# Patient Record
Sex: Female | Born: 1960 | Race: White | Hispanic: No | Marital: Single | Smoking: Former smoker
Health system: Southern US, Community
[De-identification: ages and names within clinical notes are randomized; demographics above are authoritative.]

## PROBLEM LIST (undated history)

## (undated) DIAGNOSIS — M359 Systemic involvement of connective tissue, unspecified: Secondary | ICD-10-CM

## (undated) DIAGNOSIS — R87629 Unspecified abnormal cytological findings in specimens from vagina: Secondary | ICD-10-CM

## (undated) HISTORY — PX: COLPOSCOPY: SHX161

## (undated) HISTORY — DX: Systemic involvement of connective tissue, unspecified: M35.9

## (undated) HISTORY — DX: Unspecified abnormal cytological findings in specimens from vagina: R87.629

---

## 2011-12-07 DIAGNOSIS — G47419 Narcolepsy without cataplexy: Secondary | ICD-10-CM | POA: Insufficient documentation

## 2014-12-10 ENCOUNTER — Ambulatory Visit (INDEPENDENT_AMBULATORY_CARE_PROVIDER_SITE_OTHER): Payer: Commercial Indemnity | Admitting: Osteopathic Medicine

## 2014-12-10 ENCOUNTER — Encounter: Payer: Self-pay | Admitting: Osteopathic Medicine

## 2014-12-10 VITALS — BP 134/77 | HR 63 | Wt 126.0 lb

## 2014-12-10 DIAGNOSIS — Z13220 Encounter for screening for lipoid disorders: Secondary | ICD-10-CM | POA: Diagnosis not present

## 2014-12-10 DIAGNOSIS — Z124 Encounter for screening for malignant neoplasm of cervix: Secondary | ICD-10-CM

## 2014-12-10 DIAGNOSIS — Z23 Encounter for immunization: Secondary | ICD-10-CM | POA: Diagnosis not present

## 2014-12-10 DIAGNOSIS — Z0000 Encounter for general adult medical examination without abnormal findings: Secondary | ICD-10-CM | POA: Diagnosis not present

## 2014-12-10 DIAGNOSIS — Z1211 Encounter for screening for malignant neoplasm of colon: Secondary | ICD-10-CM | POA: Diagnosis not present

## 2014-12-10 DIAGNOSIS — Z716 Tobacco abuse counseling: Secondary | ICD-10-CM | POA: Diagnosis not present

## 2014-12-10 DIAGNOSIS — Z1239 Encounter for other screening for malignant neoplasm of breast: Secondary | ICD-10-CM

## 2014-12-10 NOTE — Progress Notes (Signed)
HPI: Teresa Kelley is a 54 y.o. female who presents to [facility]  today for physical for work. No complaints. Has not been to doctor in >15 years. Preventive care reviewed as below.   Past medical, social and family history reviewed: History reviewed. No pertinent past medical history. History reviewed. No pertinent past surgical history. Social History  Substance Use Topics  . Smoking status: Current Every Day Smoker -- 1.00 packs/day for 30 years    Types: Cigarettes  . Smokeless tobacco: Not on file  . Alcohol Use: No   History reviewed. No pertinent family history.  No current outpatient prescriptions on file.   No current facility-administered medications for this visit.   No Known Allergies   Review of Systems: CONSTITUTIONAL: Neg fever/chills, no unintentional weight changes HEAD/EYES/EARS/NOSE: No headache/vision change or hearing change CARDIAC: No chest pain/pressure/palpitations, no orthopnea RESPIRATORY: No cough/shortness of breath/wheeze GASTROINTESTINAL: No nausea/vomiting/abdominal pain/blood in stool/diarrhea/constipation MUSCULOSKELETAL: No myalgia/arthralgia GENITOURINARY: No incontinence, No abnormal genital bleeding/discharge SKIN: No rash/wounds/concerning lesions HEM/ONC: No easy bruising/bleeding, no abnormal lymph node PSYCHIATRIC: No concerns with depression/anxiety or sleep problems    Exam:  Constitutional: VSS (NOTE: reviewed but neglected to enter thees due to computer problem - will address with nurse)  General Appearance: alert, well-developed, well-nourished, NAD Eyes: Normal lids and conjunctive, non-icteric sclera, PERRLA Ears, Nose, Mouth, Throat: Normal external inspection ears/nares/mouth/lips/gums, Normal TM bilaterally, MMM, posterior pharynx without erythema/exudate Neck: No masses, trachea midline. No thyroid enlargement/tenderness/mass appreciated Respiratory: Normal respiratory effort. No  dullness/hyper-resonance to percussion. Breath sounds normal, no wheeze/rhonchi/rales Cardiovascular: S1/S2 normal, no murmur/rub/gallop auscultated. No carotid bruit or JVD. No abdominal aortic bruit. Pedal pulse II/IV bilaterally DP and PT. No lower extremity edema. Gastrointestinal: Nontender, no masses. No hepatomegaly, no splenomegaly. No hernia appreciated. Rectal exam deferred.  Musculoskeletal: Gait normal. No clubbing/cyanosis of digits.  Neurological: No cranial nerve deficit on limited exam. Motor and sensation intact and symmetric Psychiatric: Normal judgment/insight. Normal mood and affect. Oriented x3.    No results found for this or any previous visit (from the past 72 hour(s)). No results found.   ASSESSMENT/PLAN:  Annual physical exam - Preventative care reviewed, see note 12/10/2014  Lipid screening - Plan: Lipid panel, COMPLETE METABOLIC PANEL WITH GFR  Flu vaccine need - Recommended 12/10/2014, patient declines today, will address at next visit - Plan: Flu vaccine, recombinant, trivalent, inj  Need for prophylactic vaccination against Streptococcus pneumoniae (pneumococcus) - Recommended 12/10/2014, patient declines today, will address at next visit - Plan: Pneumococcal polysaccharide vaccine 23-valent greater than or equal to 2yo subcutaneous/IM  Need for TD vaccine - Recommended 12/10/2014, patient declines today, will address at next visit - Plan: Td vaccine greater than or equal to 7yo preservative free IM  Screening for colon cancer - We'll refer for screening colonoscopy - Plan: Ambulatory referral to Gastroenterology  Screening for breast cancer - Will refer for mammogram - Plan: MM DIGITAL SCREENING BILATERAL  Screening for cervical cancer - Discussed need for routine Pap smear, patient declines this today  Vaccines ordered as above in the event the patient decides to come in for nurse visit for these, though she declines them at this time today   FEMALE  PREVENTIVE CARE  ANNUAL SCREENING/COUNSELING Tobacco - Patient has been educated on importance of tobacco cessation to decrease risks of cardiovascular and pulmonary disease and to improve overall health & wellbeing.  Alcohol - NONE Diet/Exercise - HEALTHY HABITS DISCUSSED, plays sports, discussed healthy diet Sexual Health/STI -  NO CONCERNS, abstinence >15 years Depression - PQH2 NEGATIVE Domestic violence - NO CONCERNS HTN - SEE VITALS Vaccination status - Not sure when last Td or Flu  INFECTIOUS DISEASE SCREENING HIV - all adults 15-65 - DECLINED GC/CT - sexually active -  DECLINED HepC - born 55-1964 - DECLINED  DISEASE SCREENING Lipid - age 51+ if risk - WILL ORDER DM2 - overweight age 20-70 or other risk factors - LOW RISK, WILL CHECK NONFASTING GLC Osteoporosis - age 68+ or one sooner if risk - NO NEED  CANCER SCREENING Cervical - Pap q3 yr age 8+, Pap + HPV q5y age 66+ - ADVISED TO HAVE THIS DONE, PATIENT WILL CONSIDER Breast - Mammo age 58+ (C) and biennial age 44-75 (A) - WILL ORDER Lung - low dose CT Chest age 34-80 - AGE 4 WILL NEED Colon - age 84+ or 53 years of age prior to FH Dx - AMENABLE TO COLONOSCOPY  VACCINATION Influenza - annual - RECOMMENDED HPV - age <48yo - NO NEED Zoster - age 18+ - NO NEED Pneumonia - age 68+ sooner if risk (DM, other) - PCV23 NEEDED IN SMOKER TD booster - RECOMMENDED  OTHER Fall - exercise and Vit D age 68+ - NO CONCERNS Consider ASA - age 34-59 - LOW RISK

## 2014-12-11 ENCOUNTER — Encounter: Payer: Self-pay | Admitting: Osteopathic Medicine

## 2014-12-11 LAB — COMPLETE METABOLIC PANEL WITH GFR
ALT: 21 U/L (ref 6–29)
AST: 18 U/L (ref 10–35)
Albumin: 4.4 g/dL (ref 3.6–5.1)
Alkaline Phosphatase: 105 U/L (ref 33–130)
BUN: 23 mg/dL (ref 7–25)
CALCIUM: 9.7 mg/dL (ref 8.6–10.4)
CHLORIDE: 106 mmol/L (ref 98–110)
CO2: 26 mmol/L (ref 20–31)
CREATININE: 0.73 mg/dL (ref 0.50–1.05)
GFR, Est African American: >89 mL/min (ref >=60)
GFR, Est Non African American: >89 mL/min (ref >=60)
GLUCOSE: 87 mg/dL (ref 65–99)
POTASSIUM: 4.5 mmol/L (ref 3.5–5.3)
SODIUM: 142 mmol/L (ref 135–146)
Total Bilirubin: 0.7 mg/dL (ref 0.2–1.2)
Total Protein: 6.7 g/dL (ref 6.1–8.1)

## 2014-12-11 LAB — LIPID PANEL
CHOLESTEROL: 235 mg/dL — AB (ref 125–200)
HDL: 65 mg/dL (ref >=46)
LDL Cholesterol: 150 mg/dL — ABNORMAL HIGH (ref <130)
Total CHOL/HDL Ratio: 3.6 Ratio (ref <=5.0)
Triglycerides: 99 mg/dL (ref <150)
VLDL: 20 mg/dL (ref <30)

## 2015-10-05 ENCOUNTER — Encounter: Payer: Self-pay | Admitting: Osteopathic Medicine

## 2015-10-05 ENCOUNTER — Ambulatory Visit (INDEPENDENT_AMBULATORY_CARE_PROVIDER_SITE_OTHER): Payer: Commercial Indemnity | Admitting: Osteopathic Medicine

## 2015-10-05 VITALS — BP 139/77 | HR 89 | Ht 64.0 in | Wt 134.0 lb

## 2015-10-05 DIAGNOSIS — R21 Rash and other nonspecific skin eruption: Secondary | ICD-10-CM

## 2015-10-05 MED ORDER — CEPHALEXIN 500 MG PO CAPS: 1000.0000 mg | ORAL_CAPSULE | Freq: Two times a day (BID) | ORAL | Status: DC

## 2015-10-05 NOTE — Progress Notes (Signed)
HPI: Teresa Kelley is a 55 y.o. female who [facility] today for chief complaint of:  Chief Complaint  Patient presents with  . Insect Bite    ON RIGHT SIDE OF NECK     . Context: Thinks assisted by insect, about 5 weeks ago, she does not remember specific bite but it was consistent with probably mosquito . Location: Right side of neck . Quality: Red, slightly raised. Rash itself is not painful but under the skin below the rash is a bit tender . Duration: Several weeks . Modifying factors: Patient had tried over-the-counter antifungal to no effect . Assoc signs/symptoms: No fever/chills, no other unusual rashes or joint pain   Past medical, social and family history reviewed: No past medical history on file. No past surgical history on file. Social History  Substance Use Topics  . Smoking status: Current Every Day Smoker -- 1.00 packs/day for 30 years    Types: Cigarettes  . Smokeless tobacco: Not on file  . Alcohol Use: No   No family history on file.  No current outpatient prescriptions on file.   No current facility-administered medications for this visit.   No Known Allergies    Review of Systems: CONSTITUTIONAL:  No  fever, no chills, No recent illness, No unintentional weight changes HEAD/EYES/EARS/NOSE/THROAT: No  headache, no vision change CARDIAC: No  chest pain RESPIRATORY: No  cough, No  shortness of breath GASTROINTESTINAL: No  nausea, No  vomiting, No  abdominal pain MUSCULOSKELETAL: No  myalgia/arthralgia SKIN: No  rash/wounds/concerning lesionsOther than as noted in the history of present illness HEM/ONC: No  easy bruising/bleeding, No  abnormal lymph node NEUROLOGIC: No  weakness, No  dizziness  Exam:  BP 139/77 mmHg  Pulse 89  Ht 5\' 4"  (1.626 m)  Wt 134 lb (60.782 kg)  BMI 22.99 kg/m2 Constitutional: VS see above. General Appearance: alert, well-developed, well-nourished, NAD Eyes: Normal lids  and conjunctive, non-icteric sclera, PERRLA Ears, Nose, Mouth, Throat: MMM, Normal external inspection ears/nares/mouth/lips Neck: No masses, trachea midline. No thyroid enlargement. No tenderness/mass appreciated. No lymphadenopathy, possible small palpable lymph node in area of tenderness inferior to rash on right side of neck, no skin changes overlying this area of tenderness Respiratory: Normal respiratory effort. no wheeze, no rhonchi, no rales Cardiovascular: S1/S2 normal, no murmur, no rub/gallop auscultated. RRR.  Skin: Approximately dime sized, round, mildly erythematous, mildly edematous patch on right side of neck, nontender, no exudate/ulceration. No central clearing consistent with target lesion. No puncture marks. And is otherwise warm, dry, intact without rash/ulcer. No concerning nevi or subq nodules on limited exam.   Psychiatric: Normal judgment/insight. Normal mood and affect. Oriented x3.      ASSESSMENT/PLAN: Rash consistent with possible persistent inflammation/allergy however given lack of pain or itching, this is more doubtful. Possible small tender lymph node underneath the area, given the erythema my concern is for possible mild cellulitic reaction, doesn't appear consistent with fungal infection which would probably be more itchy/dry/scaly. Given the location of the lesion, this would not be something that I would be comfortable doing a punch biopsy for, will trial antibiotics for possible mild cellulitis and place referral to dermatology. Patient can cancel dermatology appointment if the lesion resolves, otherwise would defer biopsy to specialist if needed versus other workup/management. ER/RTC precautions reviewed with the patient with particular regard to fever/joint pain, significant fatigue, worsening of rash, headache, other concerns. Of note, patient not sure when last tetanus vaccine was and  she declines any vaccines or other preventive care measures today.  Rash and  nonspecific skin eruption - Plan: cephALEXin (KEFLEX) 500 MG capsule    All questions were answered. Visit summary with medication list and pertinent instructions was printed for patient to review. ER/RTC precautions were reviewed with the patient. Return if symptoms worsen or fail to improve, and at your convenience for annual welness/preventive care.

## 2015-10-05 NOTE — Patient Instructions (Addendum)
If antibiotics aren't helping, we will go ahead and have you seen by dermatologist. If rash clears up, you may cancel dermatology appointment.    Rash A rash is a change in the color or texture of the skin. There are many different types of rashes. You may have other problems that accompany your rash. CAUSES   Infections.  Allergic reactions. This can include allergies to pets or foods.  Certain medicines.  Exposure to certain chemicals, soaps, or cosmetics.  Heat.  Exposure to poisonous plants.  Tumors, both cancerous and noncancerous. SYMPTOMS   Redness.  Scaly skin.  Itchy skin.  Dry or cracked skin.  Bumps.  Blisters.  Pain. DIAGNOSIS  Your caregiver may do a physical exam to determine what type of rash you have. A skin sample (biopsy) may be taken and examined under a microscope. TREATMENT  Treatment depends on the type of rash you have. Your caregiver may prescribe certain medicines. For serious conditions, you may need to see a skin doctor (dermatologist). HOME CARE INSTRUCTIONS   Avoid the substance that caused your rash.  Do not scratch your rash. This can cause infection.  You may take cool baths to help stop itching.  Only take over-the-counter or prescription medicines as directed by your caregiver.  Keep all follow-up appointments as directed by your caregiver. SEEK IMMEDIATE MEDICAL CARE IF:  You have increasing pain, swelling, or redness.  You have a fever.  You have new or severe symptoms.  You have body aches, diarrhea, or vomiting.  Your rash is not better after 3 days. MAKE SURE YOU:  Understand these instructions.  Will watch your condition.  Will get help right away if you are not doing well or get worse.   This information is not intended to replace advice given to you by your health care provider. Make sure you discuss any questions you have with your health care provider.   Document Released: 03/25/2002 Document Revised:  04/25/2014 Document Reviewed: 08/20/2014 Elsevier Interactive Patient Education [employer]2016 Elsevier Inc.

## 2015-10-09 ENCOUNTER — Telehealth: Payer: Self-pay

## 2015-10-09 DIAGNOSIS — R21 Rash and other nonspecific skin eruption: Secondary | ICD-10-CM

## 2015-10-09 NOTE — Telephone Encounter (Signed)
Teresa Kelley called and left a message stating her rash has not resolved or improved. The office note mentioned a referral to Dermatology. Can we go ahead and place the referral?

## 2015-10-09 NOTE — Telephone Encounter (Signed)
Patient advised of referral to Dermatology.

## 2015-10-09 NOTE — Telephone Encounter (Signed)
Referral placed, thanks

## 2015-12-04 ENCOUNTER — Other Ambulatory Visit: Payer: Self-pay

## 2015-12-09 ENCOUNTER — Ambulatory Visit (INDEPENDENT_AMBULATORY_CARE_PROVIDER_SITE_OTHER): Payer: Managed Care, Other (non HMO)

## 2015-12-09 DIAGNOSIS — Z1231 Encounter for screening mammogram for malignant neoplasm of breast: Secondary | ICD-10-CM | POA: Diagnosis not present

## 2015-12-09 DIAGNOSIS — Z1239 Encounter for other screening for malignant neoplasm of breast: Secondary | ICD-10-CM

## 2016-04-21 ENCOUNTER — Encounter: Payer: Self-pay | Admitting: Osteopathic Medicine

## 2016-04-21 ENCOUNTER — Ambulatory Visit (INDEPENDENT_AMBULATORY_CARE_PROVIDER_SITE_OTHER): Payer: Commercial Indemnity | Admitting: Osteopathic Medicine

## 2016-04-21 VITALS — BP 131/64 | HR 60 | Ht 64.0 in | Wt 141.0 lb

## 2016-04-21 DIAGNOSIS — Z0000 Encounter for general adult medical examination without abnormal findings: Secondary | ICD-10-CM | POA: Diagnosis not present

## 2016-04-21 DIAGNOSIS — R21 Rash and other nonspecific skin eruption: Secondary | ICD-10-CM | POA: Diagnosis not present

## 2016-04-21 DIAGNOSIS — R499 Unspecified voice and resonance disorder: Secondary | ICD-10-CM | POA: Diagnosis not present

## 2016-04-21 DIAGNOSIS — E559 Vitamin D deficiency, unspecified: Secondary | ICD-10-CM

## 2016-04-21 LAB — CBC WITH DIFFERENTIAL/PLATELET
BASOS ABS: 0 {cells}/uL (ref 0–200)
Basophils Relative: 0 %
EOS PCT: 4 %
Eosinophils Absolute: 220 cells/uL (ref 15–500)
HCT: 39.9 % (ref 35.0–45.0)
HEMOGLOBIN: 13.1 g/dL (ref 11.7–15.5)
LYMPHS ABS: 1980 {cells}/uL (ref 850–3900)
Lymphocytes Relative: 36 %
MCH: 31 pg (ref 27.0–33.0)
MCHC: 32.8 g/dL (ref 32.0–36.0)
MCV: 94.3 fL (ref 80.0–100.0)
MONO ABS: 550 {cells}/uL (ref 200–950)
MPV: 10.7 fL (ref 7.5–12.5)
Monocytes Relative: 10 %
NEUTROS ABS: 2750 {cells}/uL (ref 1500–7800)
Neutrophils Relative %: 50 %
Platelets: 231 10*3/uL (ref 140–400)
RBC: 4.23 MIL/uL (ref 3.80–5.10)
RDW: 13.6 % (ref 11.0–15.0)
WBC: 5.5 10*3/uL (ref 3.8–10.8)

## 2016-04-21 LAB — COMPLETE METABOLIC PANEL WITH GFR
ALT: 21 U/L (ref 6–29)
AST: 19 U/L (ref 10–35)
Albumin: 4.1 g/dL (ref 3.6–5.1)
Alkaline Phosphatase: 90 U/L (ref 33–130)
BILIRUBIN TOTAL: 0.7 mg/dL (ref 0.2–1.2)
BUN: 17 mg/dL (ref 7–25)
CHLORIDE: 109 mmol/L (ref 98–110)
CO2: 23 mmol/L (ref 20–31)
CREATININE: 0.76 mg/dL (ref 0.50–1.05)
Calcium: 9.6 mg/dL (ref 8.6–10.4)
GFR, Est African American: >89 mL/min (ref >=60)
GFR, Est Non African American: 89 mL/min (ref >=60)
GLUCOSE: 85 mg/dL (ref 65–99)
Potassium: 4.5 mmol/L (ref 3.5–5.3)
Sodium: 143 mmol/L (ref 135–146)
TOTAL PROTEIN: 6.6 g/dL (ref 6.1–8.1)

## 2016-04-21 LAB — LIPID PANEL
Cholesterol: 256 mg/dL — ABNORMAL HIGH (ref <200)
HDL: 67 mg/dL (ref >50)
LDL CALC: 174 mg/dL — AB (ref <100)
TRIGLYCERIDES: 73 mg/dL (ref <150)
Total CHOL/HDL Ratio: 3.8 Ratio (ref <5.0)
VLDL: 15 mg/dL (ref <30)

## 2016-04-21 LAB — TSH: TSH: 1.93 m[IU]/L

## 2016-04-21 MED ORDER — CLOBETASOL PROPIONATE 0.05 % EX OINT: 1.0000 "application " | TOPICAL_OINTMENT | Freq: Two times a day (BID) | CUTANEOUS | 1 refills | Status: DC

## 2016-04-21 MED ORDER — LORATADINE 10 MG PO TABS: 10.0000 mg | ORAL_TABLET | Freq: Every day | ORAL | 1 refills | Status: DC

## 2016-04-21 NOTE — Patient Instructions (Addendum)
This rash looks like atopic dermatitis or another benign skin irritation. I am not worried at this point about infection, cancer or a problem with the lymph nodes. Let's try another round of high-potency topical steroids for this rash, as well as oral antihistamine. If rash persists, will need to go back to dermatology for further evaluation & biopsy.   For voice, I think you need to see an ENT specialist who can do a scope to look at the vocal cords - in smokers, hoarseness or voice changes are concerning for damage due to smoking, though other causes are possible.

## 2016-04-21 NOTE — Progress Notes (Signed)
HPI: Teresa Kelley is a 56 y.o. female  who [facility], 04/21/16,  for chief complaint of:  Chief Complaint  Patient presents with  . Rash     . 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 Patient has not followed up since then and rash has progressed in a irregular circular-type pattern . Location: R side of neck,  . Quality: Nonpainful, not itchy. No ulceration, no breaks to the skin. Seems to get more or less severe, can't correlate to any triggering or relieving factors. . Assoc signs/symptoms: hoarseness (bronchitis in October and hasn't been normal since then)     Past medical, surgical, social and family history reviewed: Patient Active Problem List   Diagnosis Date Noted  . Rash and nonspecific skin eruption 10/05/2015   History reviewed. No pertinent surgical history. Social History  Substance Use Topics  . Smoking status: Current Every Day Smoker    Packs/day: 1.00    Years: 30.00    Types: Cigarettes  . Smokeless tobacco: Never Used  . Alcohol use No   History reviewed. No pertinent family history.   Current medication list and allergy/intolerance information reviewed:   No current outpatient prescriptions on file prior to visit.   No current facility-administered medications on file prior to visit.    No Known Allergies    Review of Systems:  Constitutional: No recent illness  HEENT: No  headache, no vision change, +voice changes as per HPI  Cardiac: No  chest pain, No  pressure  Respiratory:  No  shortness of breath. No  Cough  Musculoskeletal: No new myalgia/arthralgia  Skin: +Rash  Hem/Onc: No  easy  bruising/bleeding, No  abnormal lumps/bumps  Neurologic: No  weakness, No  Dizziness   Exam:  BP 131/64   Pulse 60   Ht 5\' 4"  (1.626 m)   Wt 141 lb (64 kg)   BMI 24.20 kg/m   Constitutional: VS see above. General Appearance: alert, well-developed, well-nourished, NAD  Eyes: Normal lids and conjunctive, non-icteric sclera  Ears, Nose, Mouth, Throat: MMM, Normal external inspection ears/nares/mouth/lips/gums.  Neck: No masses, trachea midline. No lymphadenopathy  Respiratory: Normal respiratory effort. no wheeze, no rhonchi, no rales  Cardiovascular: S1/S2 normal, no murmur, no rub/gallop auscultated. RRR.   Musculoskeletal: Gait normal. Symmetric and independent movement of all extremities  Neurological: Normal balance/coordination. No tremor.  Skin: warm, dry, intact. Rash as below - with comparison image from June 2017.Marland Kitchen. Blanches, very minimally raised but not papular. No scaling.   Psychiatric: Normal judgment/insight. Normal mood and affect. Oriented x3.          ASSESSMENT/PLAN:   Defer annual physical for now due to persistent rash requiring further evaluation. Consulting with some colleagues, consideration that rash may be due to nonspecific dermatitis, will trial repeat course of steroids/antihistamines, caution with biopsy due to location/anatomy, would defer to dermatology if biopsy is needed - if this round of treatment doesn't help would send back to dermatologist.  Patient states change in voice over past 2 months since recovering from bronchitis. Active smoker. I doubt has anything to do with the rash, refer to ENT for consideration of laryngoscope.  Rash and nonspecific skin eruption - Plan: CBC with Differential/Platelet, COMPLETE METABOLIC  PANEL WITH GFR, TSH, Fungal Stain, clobetasol ointment (TEMOVATE) 0.05 %, loratadine (CLARITIN) 10 MG tablet  Annual physical exam - Plan: CBC with Differential/Platelet, COMPLETE METABOLIC PANEL WITH GFR, Lipid  panel, TSH, VITAMIN D 25 Hydroxy (Vit-D Deficiency, Fractures)  Hoarseness or changing voice - Plan: Ambulatory referral to ENT    Patient Instructions  This rash looks like atopic dermatitis or another benign skin irritation. I am not worried at this point about infection, cancer or a problem with the lymph nodes. Let's try another round of high-potency topical steroids for this rash, as well as oral antihistamine. If rash persists, will need to go back to dermatology for further evaluation & biopsy.   For voice, I think you need to see an ENT specialist who can do a scope to look at the vocal cords - in smokers, hoarseness or voice changes are concerning for damage due to smoking, though other causes are possible.     Visit summary with medication list and pertinent instructions was printed for patient to review. All questions at time of visit were answered - patient instructed to contact office with any additional concerns. ER/RTC precautions were reviewed with the patient. Follow-up plan: Return for annual physical later date (within the next 6 months or so) .

## 2016-04-22 LAB — VITAMIN D 25 HYDROXY (VIT D DEFICIENCY, FRACTURES): Vit D, 25-Hydroxy: 16 ng/mL — ABNORMAL LOW (ref 30–100)

## 2016-04-25 LAB — FUNGAL STAIN

## 2016-04-26 DIAGNOSIS — E559 Vitamin D deficiency, unspecified: Secondary | ICD-10-CM | POA: Insufficient documentation

## 2016-04-26 MED ORDER — VITAMIN D (ERGOCALCIFEROL) 1.25 MG (50000 UNIT) PO CAPS: 50000.0000 [IU] | ORAL_CAPSULE | ORAL | 0 refills | Status: DC

## 2016-04-26 NOTE — Addendum Note (Signed)
Addended by: Deirdre PippinsALEXANDER, Shelley Pooley M on: 04/26/2016 07:57 AM   Modules accepted: Orders

## 2016-12-27 ENCOUNTER — Encounter: Payer: Self-pay | Admitting: Obstetrics and Gynecology

## 2016-12-27 ENCOUNTER — Ambulatory Visit (INDEPENDENT_AMBULATORY_CARE_PROVIDER_SITE_OTHER): Payer: Managed Care, Other (non HMO) | Admitting: Obstetrics and Gynecology

## 2016-12-27 VITALS — BP 106/67 | HR 68 | Ht 64.0 in | Wt 140.0 lb

## 2016-12-27 DIAGNOSIS — Z1389 Encounter for screening for other disorder: Secondary | ICD-10-CM | POA: Diagnosis not present

## 2016-12-27 DIAGNOSIS — Z01419 Encounter for gynecological examination (general) (routine) without abnormal findings: Secondary | ICD-10-CM | POA: Diagnosis not present

## 2016-12-27 DIAGNOSIS — Z113 Encounter for screening for infections with a predominantly sexual mode of transmission: Secondary | ICD-10-CM

## 2016-12-27 NOTE — Progress Notes (Signed)
Subjective:     Teresa Kelley is a 56 y.o. female G3P3 postmenopausal for 10 years here for a comprehensive physical exam. The patient reports no problems. She is not sexually active. She denies any pelvic pain. She reports the presence of a non pruritic, odorless discharge. She denies any vaginal bleeding  Past Medical History:  Diagnosis Date  . Vaginal Pap smear, abnormal    Past Surgical History:  Procedure Laterality Date  . COLPOSCOPY     Family History  Problem Relation Age of Onset  . Cancer Father   . Colon cancer Father      Social History   Social History  . Marital status: Single    Spouse name: N/A  . Number of children: N/A  . Years of education: N/A   Occupational History  . Not on file.   Social History Main Topics  . Smoking status: Current Every Day Smoker    Packs/day: 1.00    Years: 30.00    Types: Cigarettes  . Smokeless tobacco: Never Used  . Alcohol use No  . Drug use: No  . Sexual activity: No   Other Topics Concern  . Not on file   Social History Narrative  . No narrative on file   Health Maintenance  Topic Date Due  . Hepatitis C Screening  Jan 04, 1961  . HIV Screening  04/22/1976  . TETANUS/TDAP  04/22/1980  . PAP SMEAR  04/22/1982  . COLONOSCOPY  04/23/2011  . INFLUENZA VACCINE  04/21/2017 (Originally 11/16/2016)  . MAMMOGRAM  12/08/2017       Review of Systems Pertinent items are noted in HPI.   Objective:  Blood pressure 106/67, pulse 68, height 5\' 4"  (1.626 m), weight 140 lb (63.5 kg).     GENERAL: Well-developed, well-nourished female in no acute distress.  HEENT: Normocephalic, atraumatic. Sclerae anicteric.  NECK: Supple. Normal thyroid. Raised, erythematous rash which has now crossed the midline of her neck anteriorly. Non tender to touch LUNGS: Clear to auscultation bilaterally.  HEART: Regular rate and rhythm. BREASTS: Symmetric in size. No palpable masses or lymphadenopathy, skin changes, or nipple  drainage. ABDOMEN: Soft, nontender, nondistended. No organomegaly. PELVIC: Normal external female genitalia. Vagina is pink and rugated.  Normal discharge. Normal appearing cervix. Uterus is normal in size. No adnexal mass or tenderness. EXTREMITIES: No cyanosis, clubbing, or edema, 2+ distal pulses.    Assessment:    Healthy female exam.      Plan:    Pap smear with BV, yeast collected Patient is aware that she needs to schedule mammography (reminder letter was received last week) Patient will be contacted with abnormal results RTC prn Follow up with PCP for fasting labs and follow up on neck rash See After Visit Summary for Counseling Recommendations

## 2017-01-02 ENCOUNTER — Other Ambulatory Visit: Payer: Self-pay | Admitting: Obstetrics and Gynecology

## 2017-01-02 ENCOUNTER — Telehealth: Payer: Self-pay | Admitting: *Deleted

## 2017-01-02 LAB — CYTOLOGY - PAP
Bacterial vaginitis: POSITIVE — AB
CANDIDA VAGINITIS: NEGATIVE
DIAGNOSIS: NEGATIVE
HPV (WINDOPATH): NOT DETECTED
Trichomonas: NEGATIVE

## 2017-01-02 MED ORDER — METRONIDAZOLE 500 MG PO TABS: 500.0000 mg | ORAL_TABLET | Freq: Two times a day (BID) | ORAL | 0 refills | Status: DC

## 2017-01-02 NOTE — Telephone Encounter (Signed)
-----   Message from Catalina Antigua, MD sent at 01/02/2017 10:54 AM EDT ----- Please inform patient of BV infection. Rx for flagyl has been e-prescribed  Thanks  Peggy

## 2017-01-02 NOTE — Telephone Encounter (Signed)
LM on voicemail to call office for test results. 

## 2017-02-02 ENCOUNTER — Ambulatory Visit (INDEPENDENT_AMBULATORY_CARE_PROVIDER_SITE_OTHER): Payer: Managed Care, Other (non HMO) | Admitting: Family Medicine

## 2017-02-02 VITALS — BP 117/74 | HR 78 | Temp 97.7°F | Wt 141.0 lb

## 2017-02-02 DIAGNOSIS — R21 Rash and other nonspecific skin eruption: Secondary | ICD-10-CM | POA: Diagnosis not present

## 2017-02-02 DIAGNOSIS — E559 Vitamin D deficiency, unspecified: Secondary | ICD-10-CM

## 2017-02-02 DIAGNOSIS — R52 Pain, unspecified: Secondary | ICD-10-CM | POA: Diagnosis not present

## 2017-02-02 MED ORDER — METRONIDAZOLE 0.75 % EX GEL: 1.0000 "application " | Freq: Two times a day (BID) | CUTANEOUS | 3 refills | Status: DC

## 2017-02-02 NOTE — Patient Instructions (Signed)
Thank you for coming in today.  Apply the metronidazole gel to the skin twice daily.  Get labs today.  If not better consider following up with dermatology.   We are looking for lupus, dermatomyositis, thyroid disease, and others.   Recheck with me in 4 weeks to go over results and plan.    If all labs are normal we may consider treatment with Cymbalta to help manage muscle pain.    Duloxetine delayed-release capsules What is this medicine? DULOXETINE (doo LOX e teen) is used to treat depression, anxiety, and different types of chronic pain. This medicine may be used for other purposes; ask your health care provider or pharmacist if you have questions. COMMON BRAND NAME(S): Cymbalta, Irenka What should I tell my health care provider before I take this medicine? They need to know if you have any of these conditions: -bipolar disorder or a family history of bipolar disorder -glaucoma -kidney disease -liver disease -suicidal thoughts or a previous suicide attempt -taken medicines called MAOIs like Carbex, Eldepryl, Marplan, Nardil, and Parnate within 14 days -an unusual reaction to duloxetine, other medicines, foods, dyes, or preservatives -pregnant or trying to get pregnant -breast-feeding How should I use this medicine? Take this medicine by mouth with a glass of water. Follow the directions on the prescription label. Do not cut, crush or chew this medicine. You can take this medicine with or without food. Take your medicine at regular intervals. Do not take your medicine more often than directed. Do not stop taking this medicine suddenly except upon the advice of your doctor. Stopping this medicine too quickly may cause serious side effects or your condition may worsen. A special MedGuide will be given to you by the pharmacist with each prescription and refill. Be sure to read this information carefully each time. Talk to your pediatrician regarding the use of this medicine in  children. While this drug may be prescribed for children as young as 9 years of age for selected conditions, precautions do apply. Overdosage: If you think you have taken too much of this medicine contact a poison control center or emergency room at once. NOTE: This medicine is only for you. Do not share this medicine with others. What if I miss a dose? If you miss a dose, take it as soon as you can. If it is almost time for your next dose, take only that dose. Do not take double or extra doses. What may interact with this medicine? Do not take this medicine with any of the following medications: -desvenlafaxine -levomilnacipran -linezolid -MAOIs like Carbex, Eldepryl, Marplan, Nardil, and Parnate -methylene blue (injected into a vein) -milnacipran -thioridazine -venlafaxine This medicine may also interact with the following medications: -alcohol -amphetamines -aspirin and aspirin-like medicines -certain antibiotics like ciprofloxacin and enoxacin -certain medicines for blood pressure, heart disease, irregular heart beat -certain medicines for depression, anxiety, or psychotic disturbances -certain medicines for migraine headache like almotriptan, eletriptan, frovatriptan, naratriptan, rizatriptan, sumatriptan, zolmitriptan -certain medicines that treat or prevent blood clots like warfarin, enoxaparin, and dalteparin -cimetidine -fentanyl -lithium -NSAIDS, medicines for pain and inflammation, like ibuprofen or naproxen -phentermine -procarbazine -rasagiline -sibutramine -St. John's wort -theophylline -tramadol -tryptophan This list may not describe all possible interactions. Give your health care provider a list of all the medicines, herbs, non-prescription drugs, or dietary supplements you use. Also tell them if you smoke, drink alcohol, or use illegal drugs. Some items may interact with your medicine. What should I watch for while using this medicine? Tell  your doctor if your  symptoms do not get better or if they get worse. Visit your doctor or health care professional for regular checks on your progress. Because it may take several weeks to see the full effects of this medicine, it is important to continue your treatment as prescribed by your doctor. Patients and their families should watch out for new or worsening thoughts of suicide or depression. Also watch out for sudden changes in feelings such as feeling anxious, agitated, panicky, irritable, hostile, aggressive, impulsive, severely restless, overly excited and hyperactive, or not being able to sleep. If this happens, especially at the beginning of treatment or after a change in dose, call your health care professional. Bonita QuinYou may get drowsy or dizzy. Do not drive, use machinery, or do anything that needs mental alertness until you know how this medicine affects you. Do not stand or sit up quickly, especially if you are an older patient. This reduces the risk of dizzy or fainting spells. Alcohol may interfere with the effect of this medicine. Avoid alcoholic drinks. This medicine can cause an increase in blood pressure. This medicine can also cause a sudden drop in your blood pressure, which may make you feel faint and increase the chance of a fall. These effects are most common when you first start the medicine or when the dose is increased, or during use of other medicines that can cause a sudden drop in blood pressure. Check with your doctor for instructions on monitoring your blood pressure while taking this medicine. Your mouth may get dry. Chewing sugarless gum or sucking hard candy, and drinking plenty of water may help. Contact your doctor if the problem does not go away or is severe. What side effects may I notice from receiving this medicine? Side effects that you should report to your doctor or health care professional as soon as possible: -allergic reactions like skin rash, itching or hives, swelling of the face,  lips, or tongue -anxious -breathing problems -confusion -changes in vision -chest pain -confusion -elevated mood, decreased need for sleep, racing thoughts, impulsive behavior -eye pain -fast, irregular heartbeat -feeling faint or lightheaded, falls -feeling agitated, angry, or irritable -hallucination, loss of contact with reality -high blood pressure -loss of balance or coordination -palpitations -redness, blistering, peeling or loosening of the skin, including inside the mouth -restlessness, pacing, inability to keep still -seizures -stiff muscles -suicidal thoughts or other mood changes -trouble passing urine or change in the amount of urine -trouble sleeping -unusual bleeding or bruising -unusually weak or tired -vomiting -yellowing of the eyes or skin Side effects that usually do not require medical attention (report to your doctor or health care professional if they continue or are bothersome): -change in sex drive or performance -change in appetite or weight -constipation -dizziness -dry mouth -headache -increased sweating -nausea -tired This list may not describe all possible side effects. Call your doctor for medical advice about side effects. You may report side effects to FDA at 1-800-FDA-1088. Where should I keep my medicine? Keep out of the reach of children. Store at room temperature between 20 and 25 degrees C (68 to 77 degrees F). Throw away any unused medicine after the expiration date. NOTE: This sheet is a summary. It may not cover all possible information. If you have questions about this medicine, talk to your doctor, pharmacist, or health care provider.  2018 Elsevier/Gold Standard (2015-09-03 18:16:03)

## 2017-02-02 NOTE — Progress Notes (Signed)
Marrion CoyMichelle Ibbotson is a 56 y.o. female who [facility] today for rash and body aches. Patient notes a rash on her neck ongoing for over a year now. This has been present despite treatment with various steroid creams as well as a referral to dermatology. She's had 2 skin scrapings were negative. She has not had a skin biopsy. She notes the rash is not particularly itchy but does not improve. She notes however that she has developed body aches and muscle weakness in her hips and shoulders. She denies fevers or chills vomiting or diarrhea. She is concerned that there is some significant medical problem causing but the rash and the muscle aches and pain.   Past Medical History:  Diagnosis Date  . Vaginal Pap smear, abnormal    Past Surgical History:  Procedure Laterality Date  . COLPOSCOPY     Social History  Substance Use Topics  . Smoking status: Current Every Day Smoker    Packs/day: 1.00    Years: 30.00    Types: Cigarettes  . Smokeless tobacco: Never Used  . Alcohol use No   family history includes Cancer in her father; Colon cancer in her father.  ROS as above:  Medications: Current Outpatient Prescriptions  Medication Sig Dispense Refill  . b complex vitamins capsule Take 1 capsule by mouth daily.    . clobetasol ointment (TEMOVATE) 0.05 % Apply 1 application topically 2 (two) times daily. To affected area(s) as needed, max 2 weeks to avoid whitening/thinning skin 30 g 1  . loratadine (CLARITIN) 10 MG tablet Take 1 tablet (10 mg total) by mouth daily. 30 tablet 1  . Vitamin D, Ergocalciferol, (DRISDOL) 50000 units CAPS capsule Take 1 capsule (50,000 Units total) by mouth every 7 (seven) days. Take for 8 total doses(weeks) and then plan to recheck levels 8 capsule 0  . metroNIDAZOLE (METROGEL) 0.75 % gel Apply 1 application topically 2 (two) times  daily. 45 g 3   No current facility-administered medications for this visit.    Allergies  Allergen Reactions  . Amoxicillin Nausea And Vomiting    Health Maintenance Health Maintenance  Topic Date Due  . Hepatitis C Screening  23-Nov-1960  . HIV Screening  04/22/1976  . TETANUS/TDAP  04/22/1980  . COLONOSCOPY  04/23/2011  . INFLUENZA VACCINE  04/21/2017 (Originally 11/16/2016)  . MAMMOGRAM  12/08/2017  . PAP SMEAR  12/28/2019     Exam:  BP 117/74   Pulse 78   Temp 97.7 F (36.5 C) (Oral)   Wt 141 lb (64 kg)   BMI 24.20 kg/m  Gen: Well NAD HEENT: EOMI,  MMM Lungs: Normal work of breathing. CTABL Heart: RRR no MRG Abd: NABS, Soft. Nondistended, Nontender Exts: Brisk capillary refill, warm and well perfused.  Skin: Macular erythematous annular lesions with some scale on anterior neck. Similar to previously documented with pictures in the chart. MSK: Slight weakness in pain to shoulder abduction. Normal gait and hip strength.  No results found for this or any previous visit (from the past 72 hour(s)). No results found.    Assessment and Plan: 56 y.o. female with rash muscle aches and pains and weakness. The rash is not behaving like a typical contact dermatitis or tinea corporis. I'm concerned she has some rheumatologic process. Differential includes lupus and dermatomyositis. Plan to obtain the workup listed below as well as start empiric topical treatment with topical metronidazole gel. Recheck in one  month.   Orders Placed This Encounter  Procedures  . Cyclic citrul peptide antibody, IgG  . Comprehensive metabolic panel  . CBC with Differential/Platelet  . Sedimentation rate  . Rheumatoid factor  . ANA  . CK  . C-reactive protein  . Hepatitis C antibody  . HIV antibody  . TSH  . VITAMIN D 25 Hydroxy (Vit-D Deficiency, Fractures)   Meds ordered this encounter  Medications  . metroNIDAZOLE (METROGEL) 0.75 % gel    Sig: Apply 1 application topically 2 (two)  times daily.    Dispense:  45 g    Refill:  3     Discussed warning signs or symptoms. Please see discharge instructions. Patient expresses understanding.  I spent 25 minutes with this patient, greater than 50% was face-to-face time counseling regarding ddx and treatment plan.

## 2017-02-03 LAB — CBC WITH DIFFERENTIAL/PLATELET
BASOS ABS: 19 {cells}/uL (ref 0–200)
BASOS PCT: 0.2 %
EOS PCT: 1.5 %
Eosinophils Absolute: 140 cells/uL (ref 15–500)
HEMATOCRIT: 39.1 % (ref 35.0–45.0)
HEMOGLOBIN: 13.4 g/dL (ref 11.7–15.5)
LYMPHS ABS: 2260 {cells}/uL (ref 850–3900)
MCH: 31.9 pg (ref 27.0–33.0)
MCHC: 34.3 g/dL (ref 32.0–36.0)
MCV: 93.1 fL (ref 80.0–100.0)
MONOS PCT: 8.2 %
MPV: 11.7 fL (ref 7.5–12.5)
Neutro Abs: 6119 cells/uL (ref 1500–7800)
Neutrophils Relative %: 65.8 %
PLATELETS: 183 10*3/uL (ref 140–400)
RBC: 4.2 10*6/uL (ref 3.80–5.10)
RDW: 12.8 % (ref 11.0–15.0)
Total Lymphocyte: 24.3 %
WBC mixed population: 763 cells/uL (ref 200–950)
WBC: 9.3 10*3/uL (ref 3.8–10.8)

## 2017-02-03 LAB — COMPREHENSIVE METABOLIC PANEL
AG RATIO: 1.9 (calc) (ref 1.0–2.5)
ALBUMIN MSPROF: 4.4 g/dL (ref 3.6–5.1)
ALT: 22 U/L (ref 6–29)
AST: 28 U/L (ref 10–35)
Alkaline phosphatase (APISO): 94 U/L (ref 33–130)
BILIRUBIN TOTAL: 0.6 mg/dL (ref 0.2–1.2)
BUN: 24 mg/dL (ref 7–25)
CALCIUM: 9.5 mg/dL (ref 8.6–10.4)
CO2: 27 mmol/L (ref 20–32)
Chloride: 107 mmol/L (ref 98–110)
Creat: 0.75 mg/dL (ref 0.50–1.05)
GLOBULIN: 2.3 g/dL (ref 1.9–3.7)
Glucose, Bld: 81 mg/dL (ref 65–99)
POTASSIUM: 4.7 mmol/L (ref 3.5–5.3)
SODIUM: 140 mmol/L (ref 135–146)
TOTAL PROTEIN: 6.7 g/dL (ref 6.1–8.1)

## 2017-02-03 LAB — SEDIMENTATION RATE: Sed Rate: 41 mm/h — ABNORMAL HIGH (ref 0–30)

## 2017-02-03 LAB — CYCLIC CITRUL PEPTIDE ANTIBODY, IGG: Cyclic Citrullin Peptide Ab: <16 UNITS

## 2017-02-03 LAB — CK: CK TOTAL: 450 U/L — AB (ref 29–143)

## 2017-02-03 LAB — RHEUMATOID FACTOR: Rhuematoid fact SerPl-aCnc: <14 IU/mL (ref <14)

## 2017-02-03 LAB — VITAMIN D 25 HYDROXY (VIT D DEFICIENCY, FRACTURES): VIT D 25 HYDROXY: 42 ng/mL (ref 30–100)

## 2017-02-03 LAB — HEPATITIS C ANTIBODY
Hepatitis C Ab: NONREACTIVE
SIGNAL TO CUT-OFF: 0.03 (ref <1.00)

## 2017-02-03 LAB — ANTI-NUCLEAR AB-TITER (ANA TITER): ANA Titer 1: 1:160 {titer} — ABNORMAL HIGH

## 2017-02-03 LAB — TSH: TSH: 3.22 mIU/L

## 2017-02-03 LAB — C-REACTIVE PROTEIN: CRP: 5.2 mg/L (ref <8.0)

## 2017-02-03 LAB — HIV ANTIBODY (ROUTINE TESTING W REFLEX): HIV 1&2 Ab, 4th Generation: NONREACTIVE

## 2017-02-03 LAB — ANA: ANA: POSITIVE — AB

## 2017-02-09 ENCOUNTER — Telehealth: Payer: Self-pay

## 2017-02-09 NOTE — Addendum Note (Signed)
Addended by: Rodolph BongOREY, Buford Bremer S on: 02/09/2017 07:23 AM   Modules accepted: Orders

## 2017-02-09 NOTE — Telephone Encounter (Signed)
Pt called stating that [facility]  is out of her network and she would like a referral sent to Dr. Pollyann SavoyShaili Deveshwar, MD as she is in network.

## 2017-02-17 ENCOUNTER — Telehealth: Payer: Self-pay | Admitting: Family Medicine

## 2017-02-17 NOTE — Telephone Encounter (Signed)
Dr. Elveria Royalsorey    Teresa Kelley called today stating she had not heard from Rheumatology yet. I called the office she requested and they said Dr. Corliss Skainseveshwar has not reviewed the chart yet and they will schedule once she does. When I informed Teresa Kelley she asked if you would be able to give her something for pain until she can get in with them. Please advise.   Thank you  Arline Aspindy

## 2017-02-20 MED ORDER — MELOXICAM 15 MG PO TABS: 15.0000 mg | ORAL_TABLET | Freq: Every day | ORAL | 0 refills | Status: DC

## 2017-02-20 NOTE — Telephone Encounter (Signed)
We want to avoid medicine that will make it hard for Rheumatology to figure out what is wrong.  I have ordered meloxicam for pain.  Take this medicine daily as needed.

## 2017-03-02 ENCOUNTER — Ambulatory Visit: Payer: Managed Care, Other (non HMO) | Admitting: Family Medicine

## 2017-03-02 ENCOUNTER — Encounter: Payer: Self-pay | Admitting: Family Medicine

## 2017-03-02 VITALS — BP 161/87 | HR 69 | Temp 97.8°F | Ht 64.0 in | Wt 147.0 lb

## 2017-03-02 DIAGNOSIS — R21 Rash and other nonspecific skin eruption: Secondary | ICD-10-CM | POA: Diagnosis not present

## 2017-03-02 DIAGNOSIS — R748 Abnormal levels of other serum enzymes: Secondary | ICD-10-CM | POA: Diagnosis not present

## 2017-03-02 DIAGNOSIS — R7689 Other specified abnormal immunological findings in serum: Secondary | ICD-10-CM | POA: Insufficient documentation

## 2017-03-02 DIAGNOSIS — R52 Pain, unspecified: Secondary | ICD-10-CM | POA: Diagnosis not present

## 2017-03-02 DIAGNOSIS — R768 Other specified abnormal immunological findings in serum: Secondary | ICD-10-CM | POA: Diagnosis not present

## 2017-03-02 MED ORDER — OMEPRAZOLE 40 MG PO CPDR: 40.0000 mg | DELAYED_RELEASE_CAPSULE | Freq: Every day | ORAL | 6 refills | Status: DC

## 2017-03-02 NOTE — Progress Notes (Signed)
Teresa CoyMichelle Kelley is a 56 y.o. female who [facility] today for rash and myalgia.  [name] was seen a month ago for unexplained persistent rash associated with girdle muscular pain and soreness.  This is concerning for rheumatologic process and a workup was initiated showing elevated ANA: 160 titer with an elevated CK.  She was referred to rheumatology however the rheumatology of choice was not in network and the backup rheumatologist was not accepting new patients until April 2019.  [name] has done some research and found some other rheumatologist who are in network with her health insurance company.  She has symptoms are still present and quite obnoxious.  She notes the meloxicam has not been effective.  Additionally patient notes worsening acid reflux.  This is been ongoing for a few weeks.  She has not tried any treatment aside from Tums which have not been effective.   Past Medical History:  Diagnosis Date  . Vaginal Pap smear, abnormal    Past Surgical History:  Procedure Laterality Date  . COLPOSCOPY     Social History   Tobacco Use  . Smoking status: Current Every Day Smoker    Packs/day: 1.00    Years: 30.00    Pack years: 30.00    Types: Cigarettes  . Smokeless tobacco: Never Used  Substance Use Topics  . Alcohol use: No   family history includes Cancer in her father; Colon cancer in her father.  ROS as above:  Medications: Current Outpatient Medications  Medication Sig Dispense Refill  . b complex vitamins capsule Take 1 capsule by mouth daily.    . clobetasol ointment (TEMOVATE) 0.05 % Apply 1 application topically 2 (two) times daily. To affected area(s) as needed, max 2 weeks to avoid whitening/thinning skin 30 g 1  . loratadine (CLARITIN) 10 MG tablet Take 1 tablet (10 mg total) by mouth daily. 30 tablet 1  . meloxicam (MOBIC)  15 MG tablet Take 1 tablet (15 mg total) daily by mouth. 14 tablet 0  . metroNIDAZOLE (METROGEL) 0.75 % gel Apply 1 application topically 2 (two) times daily. 45 g 3  . omeprazole (PRILOSEC) 40 MG capsule Take 1 capsule (40 mg total) daily by mouth. 30 capsule 6  . Vitamin D, Ergocalciferol, (DRISDOL) 50000 units CAPS capsule Take 1 capsule (50,000 Units total) by mouth every 7 (seven) days. Take for 8 total doses(weeks) and then plan to recheck levels 8 capsule 0   No current facility-administered medications for this visit.    Allergies  Allergen Reactions  . Amoxicillin Nausea And Vomiting    Health Maintenance Health Maintenance  Topic Date Due  . TETANUS/TDAP  04/22/1980  . COLONOSCOPY  04/23/2011  . INFLUENZA VACCINE  04/21/2017 (Originally 11/16/2016)  . MAMMOGRAM  12/08/2017  . PAP SMEAR  12/28/2019  . Hepatitis C Screening  Completed  . HIV Screening  Completed     Exam:  BP (!) 161/87   Pulse 69   Temp 97.8 F (36.6 C) (Oral)   Ht 5\' 4"  (1.626 m)   Wt 147 lb 0.6 oz (66.7 kg)   BMI 25.24 kg/m  Gen: Well NAD HEENT: EOMI,  MMM Lungs: Normal work of breathing. CTABL Heart: RRR no MRG Abd: NABS, Soft. Nondistended, Nontender Exts: Brisk capillary refill, warm and well perfused.  Rash: Circular annular erythematous lesions on neck.  Lab Results  Component Value Date   ANA POSITIVE (A) 02/02/2017  Lab Results  Component Value Date   CKTOTAL 450 (H) 02/02/2017      No results found for this or any previous visit (from the past 72 hour(s)). No results found.    Assessment and Plan: 56 y.o. female with associated rash and positive rheumatologic markers.  Concerning for lupus or dermatomyositis.  We will try again to refer to rheumatology.  Patient will contact me in a week for an update regardless.  I would like to wait for rheumatology evaluation for a start treatment for presumed lupus or dermatomyositis however I would not wait indefinitely.  If not better  and if no referral in the near future will start treatment.  Acid reflux symptoms unclear etiology possible recent meloxicam.  Advised patient to stop meloxicam because is not effective and possibly causing acid reflux.  Will treat also with omeprazole.   Orders Placed This Encounter  Procedures  . Ambulatory referral to Rheumatology    Referral Priority:   Routine    Referral Type:   Consultation    Referral Reason:   Specialty Services Required    Referred to Provider:   Francee GentileZiolkowska, Aldona, MD    Number of Visits Requested:   1   Meds ordered this encounter  Medications  . omeprazole (PRILOSEC) 40 MG capsule    Sig: Take 1 capsule (40 mg total) daily by mouth.    Dispense:  30 capsule    Refill:  6     Discussed warning signs or symptoms. Please see discharge instructions. Patient expresses understanding.

## 2017-03-02 NOTE — Patient Instructions (Addendum)
Thank you for coming in today. Let me know if you do not get an answer back soon about Rheumatology.  We can figure something out.  Try to get  Me a list of approved doctors if you cant get in the Dr Herma CarsonZ.  Dont let this go more than 1 week.   We likely will be using a combination of steroids like prednisone initially and the medicines like Plaquinil or Methotrexate.   Start omaprazole.

## 2017-05-25 ENCOUNTER — Ambulatory Visit: Payer: Managed Care, Other (non HMO) | Admitting: Family Medicine

## 2017-05-25 ENCOUNTER — Encounter: Payer: Self-pay | Admitting: Family Medicine

## 2017-05-25 VITALS — BP 129/82 | HR 97 | Ht 64.0 in | Wt 137.0 lb

## 2017-05-25 DIAGNOSIS — IMO0001 Reserved for inherently not codable concepts without codable children: Secondary | ICD-10-CM | POA: Insufficient documentation

## 2017-05-25 DIAGNOSIS — F17200 Nicotine dependence, unspecified, uncomplicated: Secondary | ICD-10-CM | POA: Insufficient documentation

## 2017-05-25 DIAGNOSIS — R768 Other specified abnormal immunological findings in serum: Secondary | ICD-10-CM | POA: Diagnosis not present

## 2017-05-25 DIAGNOSIS — R21 Rash and other nonspecific skin eruption: Secondary | ICD-10-CM | POA: Diagnosis not present

## 2017-05-25 MED ORDER — VARENICLINE TARTRATE 1 MG PO TABS: 1.0000 mg | ORAL_TABLET | Freq: Two times a day (BID) | ORAL | 3 refills | Status: DC

## 2017-05-25 MED ORDER — VARENICLINE TARTRATE 0.5 MG X 11 & 1 MG X 42 PO MISC: ORAL | 0 refills | Status: DC

## 2017-05-25 MED ORDER — HYDROXYCHLOROQUINE SULFATE 200 MG PO TABS: 200.0000 mg | ORAL_TABLET | Freq: Every day | ORAL | 0 refills | Status: DC

## 2017-05-25 MED ORDER — PREDNISONE 5 MG PO TABS: 10.0000 mg | ORAL_TABLET | Freq: Every day | ORAL | 0 refills | Status: DC

## 2017-05-25 NOTE — Patient Instructions (Signed)
Thank you for coming in today. For smoking start Chantix Recheck with Dr Lyn HollingsheadAlexander in 1 month Return sooner if needed.   For Rheumatology? Continue prednisone and Plaquenil.  Call the rheumatology office about referral and PA.

## 2017-05-25 NOTE — Progress Notes (Signed)
Teresa Kelley is a 57 y.o. female who presents to [facility]: Primary Care Sports Medicine today for follow-up rash and myalgia.  Teresa Kelley has been seen several times in the fall 2018 for rash and myalgias with positive ANA.  She was referred to rheumatology who treated with 10 mg of prednisone daily and Plaquenil with a suspicion for lupus.  Rheumatology extended workup and she had positive Sjogren's Antibodies.  She has a follow-up appointment with rheumatology later this month.  She has a problem where the PA she saw rheumatology is not in network but the physician at the same rheumatology office is in network.  She needs a new referral.  She is feeling great on prednisone and Plaquenil but will run out before her next follow-up appointment.  Additionally she would like to discuss smoking cessation.  She currently smokes about a pack a day and has done so for 30 years.  She would like to quit smoking.  She tried some nicotine replacement gum in the past and found to be mildly helpful.  She is very motivated and eager to quit smoking.     Past Medical History:  Diagnosis Date  . Vaginal Pap smear, abnormal    Past Surgical History:  Procedure Laterality Date  . COLPOSCOPY     Social History   Tobacco Use  . Smoking status: Current Every Day Smoker    Packs/day: 1.00    Years: 30.00    Pack years: 30.00    Types: Cigarettes  . Smokeless tobacco: Never Used  Substance Use Topics  . Alcohol use: No   family history includes Cancer in her father; Colon cancer in her father.  ROS as above:  Medications: Current Outpatient Medications  Medication Sig Dispense Refill  . b complex vitamins capsule Take 1 capsule by mouth daily.    . clobetasol ointment (TEMOVATE) 0.05 % Apply 1 application topically 2 (two) times daily. To affected area(s) as needed, max 2 weeks to avoid whitening/thinning  skin 30 g 1  . hydroxychloroquine (PLAQUENIL) 200 MG tablet Take 1 tablet (200 mg total) by mouth daily. 90 tablet 0  . loratadine (CLARITIN) 10 MG tablet Take 1 tablet (10 mg total) by mouth daily. 30 tablet 1  . meloxicam (MOBIC) 15 MG tablet Take 1 tablet (15 mg total) daily by mouth. 14 tablet 0  . metroNIDAZOLE (METROGEL) 0.75 % gel Apply 1 application topically 2 (two) times daily. 45 g 3  . omeprazole (PRILOSEC) 40 MG capsule Take 1 capsule (40 mg total) daily by mouth. 30 capsule 6  . predniSONE (DELTASONE) 5 MG tablet Take 2 tablets (10 mg total) by mouth daily with breakfast. 180 tablet 0  . Vitamin D, Ergocalciferol, (DRISDOL) 50000 units CAPS capsule Take 1 capsule (50,000 Units total) by mouth every 7 (seven) days. Take for 8 total doses(weeks) and then plan to recheck levels 8 capsule 0  . varenicline (CHANTIX STARTING MONTH PAK) 0.5 MG X 11 & 1 MG X 42 tablet Take one 0.5mg  tablet by mouth once daily for 3 days, then increase to one 0.5mg  tablet twice daily for 3 days, then increase to one 1mg  tablet twice daily. 53 tablet 0  . varenicline (CHANTIX) 1 MG tablet Take 1 tablet (1 mg total) by mouth 2 (two) times daily. 60 tablet 3   No current facility-administered medications for this visit.    Allergies  Allergen Reactions  . Amoxicillin Nausea And Vomiting  Health Maintenance Health Maintenance  Topic Date Due  . TETANUS/TDAP  04/22/1980  . COLONOSCOPY  04/23/2011  . INFLUENZA VACCINE  11/16/2016  . MAMMOGRAM  12/08/2017  . PAP SMEAR  12/28/2019  . Hepatitis C Screening  Completed  . HIV Screening  Completed     Exam:  BP 129/82   Pulse 97   Ht 5\' 4"  (1.626 m)   Wt 137 lb (62.1 kg)   SpO2 97%   BMI 23.52 kg/m  Gen: Well NAD HEENT: EOMI,  MMM Lungs: Normal work of breathing. CTABL Heart: RRR no MRG Abd: NABS, Soft. Nondistended, Nontender Exts: Brisk capillary refill, warm and well perfused.  Skin: Rash barely present as a macular mildly erythematous  scattered spots on neck   No results found for this or any previous visit (from the past 72 hour(s)). No results found.    Assessment and Plan: 57 y.o. female with  Rash and myalgia unclear etiology still with rheumatology.  She has a follow-up appointment and I am eager to read the formal diagnosis.  I think it is reasonable for her to continue her current medications which I have refilled.  Ultimately rheumatology should take over care.  I am happy to see her as needed for musculoskeletal issues.  Smoking cessation: This is probably the most important health decision she can make.  I am eager to support it and will start Chantix after discussion.  Follow-up with PCP in 1 month.   Orders Placed This Encounter  Procedures  . Ambulatory referral to Rheumatology    Referral Priority:   Routine    Referral Type:   Consultation    Referral Reason:   Specialty Services Required    Referred to Provider:   Early OsmondPatel, Amit M, MD    Requested Specialty:   Rheumatology    Number of Visits Requested:   1   Meds ordered this encounter  Medications  . hydroxychloroquine (PLAQUENIL) 200 MG tablet    Sig: Take 1 tablet (200 mg total) by mouth daily.    Dispense:  90 tablet    Refill:  0  . predniSONE (DELTASONE) 5 MG tablet    Sig: Take 2 tablets (10 mg total) by mouth daily with breakfast.    Dispense:  180 tablet    Refill:  0  . varenicline (CHANTIX STARTING MONTH PAK) 0.5 MG X 11 & 1 MG X 42 tablet    Sig: Take one 0.5mg  tablet by mouth once daily for 3 days, then increase to one 0.5mg  tablet twice daily for 3 days, then increase to one 1mg  tablet twice daily.    Dispense:  53 tablet    Refill:  0  . varenicline (CHANTIX) 1 MG tablet    Sig: Take 1 tablet (1 mg total) by mouth 2 (two) times daily.    Dispense:  60 tablet    Refill:  3     Discussed warning signs or symptoms. Please see discharge instructions. Patient expresses understanding.  I spent 25 minutes with this patient,  greater than 50% was face-to-face time counseling regarding ddx and treatment plan and clarifying my role vs PCP role.

## 2017-06-08 ENCOUNTER — Ambulatory Visit (INDEPENDENT_AMBULATORY_CARE_PROVIDER_SITE_OTHER): Payer: Managed Care, Other (non HMO) | Admitting: Obstetrics and Gynecology

## 2017-06-08 ENCOUNTER — Encounter: Payer: Self-pay | Admitting: Obstetrics and Gynecology

## 2017-06-08 VITALS — BP 107/64 | HR 66 | Ht 64.0 in | Wt 142.0 lb

## 2017-06-08 DIAGNOSIS — Z113 Encounter for screening for infections with a predominantly sexual mode of transmission: Secondary | ICD-10-CM

## 2017-06-08 DIAGNOSIS — N760 Acute vaginitis: Secondary | ICD-10-CM | POA: Diagnosis not present

## 2017-06-08 NOTE — Progress Notes (Signed)
PT c/o vaginal discharge- no odor or itch

## 2017-06-08 NOTE — Progress Notes (Signed)
57 yo here for the evaluation of the presence f a non-pruritic, odorless discharge for the past 2 weeks. She states that the discharge makes her feel "dirty" and self conscious. She denies any abdominal pain. She is without any other complaints. She admits to sweating a bit more these past few weeks.  Past Medical History:  Diagnosis Date  . Vaginal Pap smear, abnormal    Past Surgical History:  Procedure Laterality Date  . COLPOSCOPY     Family History  Problem Relation Age of Onset  . Cancer Father   . Colon cancer Father    Social History   Tobacco Use  . Smoking status: Current Every Day Smoker    Packs/day: 1.00    Years: 30.00    Pack years: 30.00    Types: Cigarettes  . Smokeless tobacco: Never Used  Substance Use Topics  . Alcohol use: No  . Drug use: No   ROS See pertinent in HPI  Blood pressure 107/64, pulse 66, height 5\' 4"  (1.626 m), weight 142 lb (64.4 kg). GENERAL: Well-developed, well-nourished female in no acute distress.  ABDOMEN: Soft, nontender, nondistended. No organomegaly. PELVIC: Normal external female genitalia. Vagina is pink and rugated.  Normal discharge. Normal appearing cervix. Uterus is normal in size. No adnexal mass or tenderness. EXTREMITIES: No cyanosis, clubbing, or edema, 2+ distal pulses.  A/P 57 yo with vaginitis - wet prep collected - patient will be contacted with abnormal discharge - vulva care instructions provided - RTC prn

## 2017-06-09 LAB — CERVICOVAGINAL ANCILLARY ONLY
BACTERIAL VAGINITIS: NEGATIVE
CANDIDA VAGINITIS: NEGATIVE
Chlamydia: NEGATIVE
NEISSERIA GONORRHEA: NEGATIVE
TRICH (WINDOWPATH): NEGATIVE

## 2017-06-12 ENCOUNTER — Telehealth: Payer: Self-pay

## 2017-06-12 NOTE — Telephone Encounter (Signed)
Left message on pt's phone asking her to call office for results (negative aptima)

## 2017-06-12 NOTE — Telephone Encounter (Signed)
Spoke with pt & let her know that her Aptima results were all negative. Pt states that she is still having the vaginal discharge. I offered the pt an appt to come see the doctor again & she is going to wait and see if it gets better

## 2017-06-22 ENCOUNTER — Encounter: Payer: Self-pay | Admitting: Osteopathic Medicine

## 2017-06-22 ENCOUNTER — Ambulatory Visit: Payer: Managed Care, Other (non HMO) | Admitting: Osteopathic Medicine

## 2017-06-22 VITALS — BP 119/70 | HR 67 | Temp 98.0°F | Wt 142.1 lb

## 2017-06-22 DIAGNOSIS — Z716 Tobacco abuse counseling: Secondary | ICD-10-CM

## 2017-06-22 DIAGNOSIS — R350 Frequency of micturition: Secondary | ICD-10-CM | POA: Diagnosis not present

## 2017-06-22 LAB — POCT URINALYSIS DIPSTICK
Bilirubin, UA: NEGATIVE
Glucose, UA: NEGATIVE
KETONES UA: NEGATIVE
NITRITE UA: NEGATIVE
PH UA: 6.5 (ref 5.0–8.0)
Protein, UA: NEGATIVE
Spec Grav, UA: 1.02 (ref 1.010–1.025)
UROBILINOGEN UA: 0.2 U/dL

## 2017-06-22 MED ORDER — NITROFURANTOIN MONOHYD MACRO 100 MG PO CAPS: 100.0000 mg | ORAL_CAPSULE | Freq: Two times a day (BID) | ORAL | 0 refills | Status: DC

## 2017-06-22 MED ORDER — BUPROPION HCL ER (XL) 150 MG PO TB24: 150.0000 mg | ORAL_TABLET | ORAL | 0 refills | Status: DC

## 2017-06-22 NOTE — Progress Notes (Signed)
HPI: Teresa Kelley is a 57 y.o. female who  has a past medical history of Vaginal Pap smear, abnormal.  she presents to [facility] today, 06/22/17,  for chief complaint of:  Smoking cessation   Patient saw Dr. Denyse Amass to 11/04/2017. At that visit, discussed smoking cessation. At that time, smoking about a pack a day for 30 years and would like to quit. Has tried nicotine replacement gum in the past which was mildly helpful. Dr. Denyse Amass started Chantix and advise follow-up with me in 1 month, patient is here to recheck.   Still smoking. Not taking Chantix. 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   At end of visit, mentioned some lower back pain on L and urinary frequency for the past week or so.    Past medical history, surgical history, social history and family history reviewed. No updates needed.   Current medication list and allergy/intolerance information reviewed.    Current Outpatient Medications on File Prior to Visit  Medication Sig Dispense Refill  . b complex vitamins capsule Take 1 capsule by mouth daily.    . clobetasol ointment (TEMOVATE) 0.05 % Apply 1 application topically 2 (two) times daily. To affected area(s) as needed, max 2 weeks to avoid whitening/thinning skin 30 g 1  . hydroxychloroquine (PLAQUENIL) 200 MG tablet Take 1 tablet (200 mg total) by mouth daily. 90 tablet 0  . loratadine (CLARITIN) 10 MG tablet Take 1 tablet (10 mg total) by mouth daily. 30 tablet 1  . meloxicam (MOBIC) 15 MG tablet Take 1 tablet (15 mg total) daily by mouth. 14 tablet 0  . metroNIDAZOLE (METROGEL) 0.75 % gel Apply 1 application topically 2 (two) times daily. (Patient not taking: Reported on 06/08/2017) 45 g 3  . omeprazole (PRILOSEC) 40 MG capsule Take 1 capsule (40 mg total) daily by mouth. 30 capsule 6  . predniSONE (DELTASONE) 5 MG tablet Take 2 tablets (10 mg total) by mouth daily with  breakfast. (Patient not taking: Reported on 06/08/2017) 180 tablet 0  . varenicline (CHANTIX STARTING MONTH PAK) 0.5 MG X 11 & 1 MG X 42 tablet Take one 0.5mg  tablet by mouth once daily for 3 days, then increase to one 0.5mg  tablet twice daily for 3 days, then increase to one 1mg  tablet twice daily. 53 tablet 0  . varenicline (CHANTIX) 1 MG tablet Take 1 tablet (1 mg total) by mouth 2 (two) times daily. (Patient not taking: Reported on 06/08/2017) 60 tablet 3  . Vitamin D, Ergocalciferol, (DRISDOL) 50000 units CAPS capsule Take 1 capsule (50,000 Units total) by mouth every 7 (seven) days. Take for 8 total doses(weeks) and then plan to recheck levels 8 capsule 0   No current facility-administered medications on file prior to visit.    Allergies  Allergen Reactions  . Amoxicillin Nausea And Vomiting      Review of Systems:  Constitutional: No recent illness  Cardiac: No  chest pain  Respiratory:  No  shortness of breath. No  Cough  Gastrointestinal: No  abdominal pain  Exam:  BP 119/70   Pulse 67   Temp 98 F (36.7 C) (Oral)   Wt 142 lb 1.3 oz (64.4 kg)   BMI 24.39 kg/m   Constitutional: VS see above. General Appearance: alert, well-developed, well-nourished, NAD  Eyes: Normal lids and conjunctive, non-icteric sclera  Ears, Nose, Mouth, Throat: MMM, Normal external inspection ears/nares/mouth/lips/gums.  Neck: No masses, trachea midline.  Respiratory: Normal respiratory effort. no wheeze, no rhonchi, no rales  Cardiovascular: S1/S2 normal, no murmur, no rub/gallop auscultated. RRR.   Musculoskeletal: Gait normal. Symmetric and independent movement of all extremities  Neurological: Normal balance/coordination. No tremor.  Skin: warm, dry, intact.   Psychiatric: Normal judgment/insight. Normal mood and affect. Oriented x3.    Results for orders placed or performed in visit on 06/22/17 (from the past 24 hour(s))  POCT Urinalysis Dipstick     Status: Abnormal    Collection Time: 06/22/17  1:44 PM  Result Value Ref Range   Color, UA yellow    Clarity, UA clear    Glucose, UA negative    Bilirubin, UA negative    Ketones, UA negative    Spec Grav, UA 1.020 1.010 - 1.025   Blood, UA trace-lysed    pH, UA 6.5 5.0 - 8.0   Protein, UA negative    Urobilinogen, UA 0.2 0.2 or 1.0 E.U./dL   Nitrite, UA negative    Leukocytes, UA Small (1+) (A) Negative   Appearance     Odor      ASSESSMENT/PLAN:   Encounter for tobacco use cessation counseling - trial Wellbutrin. Risks and side effects discussed - call if any problems   Urinary frequency - Plan: POCT Urinalysis Dipstick, Urine Culture, Urinalysis, microscopic only   Meds ordered this encounter  Medications  . buPROPion (WELLBUTRIN XL) 150 MG 24 hr tablet    Sig: Take 1 tablet (150 mg total) by mouth every morning.    Dispense:  90 tablet    Refill:  0  . nitrofurantoin, macrocrystal-monohydrate, (MACROBID) 100 MG capsule    Sig: Take 1 capsule (100 mg total) by mouth 2 (two) times daily.    Dispense:  10 capsule    Refill:  0    Patient Instructions  Steps to Quit Smoking Smoking tobacco can be harmful to your health and can affect almost every organ in your body. Smoking puts you, and those around you, at risk for developing many serious chronic diseases. Quitting smoking is difficult, but it is one of the best things that you can do for your health. It is never too late to quit. What are the benefits of quitting smoking? When you quit smoking, you lower your risk of developing serious diseases and conditions, such as:  Lung cancer or lung disease, such as COPD.  Heart disease.  Stroke.  Heart attack.  Infertility.  Osteoporosis and bone fractures.  Additionally, symptoms such as coughing, wheezing, and shortness of breath may get better when you quit. You may also find that you get sick less often because your body is stronger at fighting off colds and infections. If you are  pregnant, quitting smoking can help to reduce your chances of having a baby of low birth weight. How do I get ready to quit? When you decide to quit smoking, create a plan to make sure that you are successful. Before you quit:  Pick a date to quit. Set a date within the next two weeks to give you time to prepare.  Write down the reasons why you are quitting. Keep this list in places where you will see it often, such as on your bathroom mirror or in your car or wallet.  Identify the people, places, things, and activities that make you want to smoke (triggers) and avoid them. Make sure to take these actions: ? Throw away all cigarettes at home, at work, and in your car. ? Throw  away smoking accessories, such as Set designer. ? Clean your car and make sure to empty the ashtray. ? Clean your home, including curtains and carpets.  Tell your family, friends, and coworkers that you are quitting. Support from your loved ones can make quitting easier.  Talk with your health care provider about your options for quitting smoking.  Find out what treatment options are covered by your health insurance.  What strategies can I use to quit smoking? Talk with your healthcare provider about different strategies to quit smoking. Some strategies include:  Quitting smoking altogether instead of gradually lessening how much you smoke over a period of time. Research shows that quitting "cold [place]" is more successful than gradually quitting.  Attending in-person counseling to help you build problem-solving skills. You are more likely to have success in quitting if you attend several counseling sessions. Even short sessions of 10 minutes can be effective.  Finding resources and support systems that can help you to quit smoking and remain smoke-free after you quit. These resources are most helpful when you use them often. They can include: ? Online chats with a Veterinary surgeon. ? Telephone  quitlines. ? Automotive engineer. ? Support groups or group counseling. ? Text messaging programs. ? Mobile phone applications.  Taking medicines to help you quit smoking. (If you are pregnant or breastfeeding, talk with your health care provider first.) Some medicines contain nicotine and some do not. Both types of medicines help with cravings, but the medicines that include nicotine help to relieve withdrawal symptoms. Your health care provider may recommend: ? Nicotine patches, gum, or lozenges. ? Nicotine inhalers or sprays. ? Non-nicotine medicine that is taken by mouth.  Talk with your health care provider about combining strategies, such as taking medicines while you are also receiving in-person counseling. Using these two strategies together makes you more likely to succeed in quitting than if you used either strategy on its own. If you are pregnant or breastfeeding, talk with your health care provider about finding counseling or other support strategies to quit smoking. Do not take medicine to help you quit smoking unless told to do so by your health care provider. What things can I do to make it easier to quit? Quitting smoking might feel overwhelming at first, but there is a lot that you can do to make it easier. Take these important actions:  Reach out to your family and friends and ask that they support and encourage you during this time. Call telephone quitlines, reach out to support groups, or work with a counselor for support.  Ask people who smoke to avoid smoking around you.  Avoid places that trigger you to smoke, such as bars, parties, or smoke-break areas at work.  Spend time around people who do not smoke.  Lessen stress in your life, because stress can be a smoking trigger for some people. To lessen stress, try: ? Exercising regularly. ? Deep-breathing exercises. ? Yoga. ? Meditating. ? Performing a body scan. This involves closing your eyes, scanning your  body from head to toe, and noticing which parts of your body are particularly tense. Purposefully relax the muscles in those areas.  Download or purchase mobile phone or tablet apps (applications) that can help you stick to your quit plan by providing reminders, tips, and encouragement. There are many free apps, such as QuitGuide from the [employer] developer for Disease Control and Prevention). You can find other support for quitting smoking (smoking cessation) through smokefree.gov and other  websites.  How will I feel when I quit smoking? Within the first 24 hours of quitting smoking, you may start to feel some withdrawal symptoms. These symptoms are usually most noticeable 2-3 days after quitting, but they usually do not last beyond 2-3 weeks. Changes or symptoms that you might experience include:  Mood swings.  Restlessness, anxiety, or irritation.  Difficulty concentrating.  Dizziness.  Strong cravings for sugary foods in addition to nicotine.  Mild weight gain.  Constipation.  Nausea.  Coughing or a sore throat.  Changes in how your medicines work in your body.  A depressed mood.  Difficulty sleeping (insomnia).  After the first 2-3 weeks of quitting, you may start to notice more positive results, such as:  Improved sense of smell and taste.  Decreased coughing and sore throat.  Slower heart rate.  Lower blood pressure.  Clearer skin.  The ability to breathe more easily.  Fewer sick days.  Quitting smoking is very challenging for most people. Do not get discouraged if you are not successful the first time. Some people need to make many attempts to quit before they achieve long-term success. Do your best to stick to your quit plan, and talk with your health care provider if you have any questions or concerns. This information is not intended to replace advice given to you by your health care provider. Make sure you discuss any questions you have with your health care  provider. Document Released: 03/29/2001 Document Revised: 12/01/2015 Document Reviewed: 08/19/2014 Elsevier Interactive Patient Education  2018 [employer].     Follow-up plan: Return in about 6 weeks (around 08/03/2017) for ANNUAL PHYSICAL (AM appt, come fasting) .  Visit summary with medication list and pertinent instructions was printed for patient to review, alert [place] if any changes needed. All questions at time of visit were answered - patient instructed to contact office with any additional concerns. ER/RTC precautions were reviewed with the patient and understanding verbalized.   Note: Total time spent 25 minutes, greater than 50% of the visit was spent face-to-face counseling and coordinating care for the following: The primary encounter diagnosis was Encounter for tobacco use cessation counseling. A diagnosis of Urinary frequency was also pertinent to this visit.Marland Kitchen  Please note: voice recognition software was used to produce this document, and typos may escape review. Please contact Dr. Lyn Hollingshead for any needed clarifications.

## 2017-06-22 NOTE — Patient Instructions (Signed)
Steps to Quit Smoking Smoking tobacco can be harmful to your health and can affect almost every organ in your body. Smoking puts you, and those around you, at risk for developing many serious chronic diseases. Quitting smoking is difficult, but it is one of the best things that you can do for your health. It is never too late to quit. What are the benefits of quitting smoking? When you quit smoking, you lower your risk of developing serious diseases and conditions, such as:  Lung cancer or lung disease, such as COPD.  Heart disease.  Stroke.  Heart attack.  Infertility.  Osteoporosis and bone fractures.  Additionally, symptoms such as coughing, wheezing, and shortness of breath may get better when you quit. You may also find that you get sick less often because your body is stronger at fighting off colds and infections. If you are pregnant, quitting smoking can help to reduce your chances of having a baby of low birth weight. How do I get ready to quit? When you decide to quit smoking, create a plan to make sure that you are successful. Before you quit:  Pick a date to quit. Set a date within the next two weeks to give you time to prepare.  Write down the reasons why you are quitting. Keep this list in places where you will see it often, such as on your bathroom mirror or in your car or wallet.  Identify the people, places, things, and activities that make you want to smoke (triggers) and avoid them. Make sure to take these actions: ? Throw away all cigarettes at home, at work, and in your car. ? Throw away smoking accessories, such as ashtrays and lighters. ? Clean your car and make sure to empty the ashtray. ? Clean your home, including curtains and carpets.  Tell your family, friends, and coworkers that you are quitting. Support from your loved ones can make quitting easier.  Talk with your health care provider about your options for quitting smoking.  Find out what treatment  options are covered by your health insurance.  What strategies can I use to quit smoking? Talk with your healthcare provider about different strategies to quit smoking. Some strategies include:  Quitting smoking altogether instead of gradually lessening how much you smoke over a period of time. Research shows that quitting "cold [place]" is more successful than gradually quitting.  Attending in-person counseling to help you build problem-solving skills. You are more likely to have success in quitting if you attend several counseling sessions. Even short sessions of 10 minutes can be effective.  Finding resources and support systems that can help you to quit smoking and remain smoke-free after you quit. These resources are most helpful when you use them often. They can include: ? Online chats with a counselor. ? Telephone quitlines. ? Printed self-help materials. ? Support groups or group counseling. ? Text messaging programs. ? Mobile phone applications.  Taking medicines to help you quit smoking. (If you are pregnant or breastfeeding, talk with your health care provider first.) Some medicines contain nicotine and some do not. Both types of medicines help with cravings, but the medicines that include nicotine help to relieve withdrawal symptoms. Your health care provider may recommend: ? Nicotine patches, gum, or lozenges. ? Nicotine inhalers or sprays. ? Non-nicotine medicine that is taken by mouth.  Talk with your health care provider about combining strategies, such as taking medicines while you are also receiving in-person counseling. Using these two strategies together   makes you more likely to succeed in quitting than if you used either strategy on its own. If you are pregnant or breastfeeding, talk with your health care provider about finding counseling or other support strategies to quit smoking. Do not take medicine to help you quit smoking unless told to do so by your health care  provider. What things can I do to make it easier to quit? Quitting smoking might feel overwhelming at first, but there is a lot that you can do to make it easier. Take these important actions:  Reach out to your family and friends and ask that they support and encourage you during this time. Call telephone quitlines, reach out to support groups, or work with a counselor for support.  Ask people who smoke to avoid smoking around you.  Avoid places that trigger you to smoke, such as bars, parties, or smoke-break areas at work.  Spend time around people who do not smoke.  Lessen stress in your life, because stress can be a smoking trigger for some people. To lessen stress, try: ? Exercising regularly. ? Deep-breathing exercises. ? Yoga. ? Meditating. ? Performing a body scan. This involves closing your eyes, scanning your body from head to toe, and noticing which parts of your body are particularly tense. Purposefully relax the muscles in those areas.  Download or purchase mobile phone or tablet apps (applications) that can help you stick to your quit plan by providing reminders, tips, and encouragement. There are many free apps, such as QuitGuide from the CDC (Centers for Disease Control and Prevention). You can find other support for quitting smoking (smoking cessation) through smokefree.gov and other websites.  How will I feel when I quit smoking? Within the first 24 hours of quitting smoking, you may start to feel some withdrawal symptoms. These symptoms are usually most noticeable 2-3 days after quitting, but they usually do not last beyond 2-3 weeks. Changes or symptoms that you might experience include:  Mood swings.  Restlessness, anxiety, or irritation.  Difficulty concentrating.  Dizziness.  Strong cravings for sugary foods in addition to nicotine.  Mild weight gain.  Constipation.  Nausea.  Coughing or a sore throat.  Changes in how your medicines work in your  body.  A depressed mood.  Difficulty sleeping (insomnia).  After the first 2-3 weeks of quitting, you may start to notice more positive results, such as:  Improved sense of smell and taste.  Decreased coughing and sore throat.  Slower heart rate.  Lower blood pressure.  Clearer skin.  The ability to breathe more easily.  Fewer sick days.  Quitting smoking is very challenging for most people. Do not get discouraged if you are not successful the first time. Some people need to make many attempts to quit before they achieve long-term success. Do your best to stick to your quit plan, and talk with your health care provider if you have any questions or concerns. This information is not intended to replace advice given to you by your health care provider. Make sure you discuss any questions you have with your health care provider. Document Released: 03/29/2001 Document Revised: 12/01/2015 Document Reviewed: 08/19/2014 Elsevier Interactive Patient Education  2018 [employer] Inc.  

## 2017-06-23 LAB — URINE CULTURE
MICRO NUMBER:: 90295389
RESULT: NO GROWTH
SPECIMEN QUALITY: ADEQUATE

## 2017-06-23 LAB — URINALYSIS, MICROSCOPIC ONLY
Bacteria, UA: NONE SEEN /HPF
HYALINE CAST: NONE SEEN /LPF
RBC / HPF: NONE SEEN /HPF (ref 0–2)
Squamous Epithelial / LPF: NONE SEEN /HPF (ref <=5)

## 2017-08-03 ENCOUNTER — Encounter: Payer: Self-pay | Admitting: Osteopathic Medicine

## 2017-08-03 DIAGNOSIS — Z0189 Encounter for other specified special examinations: Secondary | ICD-10-CM

## 2017-08-16 ENCOUNTER — Other Ambulatory Visit: Payer: Self-pay | Admitting: Family Medicine

## 2017-08-22 ENCOUNTER — Other Ambulatory Visit: Payer: Self-pay | Admitting: Family Medicine

## 2017-08-26 ENCOUNTER — Other Ambulatory Visit: Payer: Self-pay | Admitting: Family Medicine

## 2017-09-16 ENCOUNTER — Other Ambulatory Visit: Payer: Self-pay | Admitting: Osteopathic Medicine

## 2017-12-13 ENCOUNTER — Emergency Department (INDEPENDENT_AMBULATORY_CARE_PROVIDER_SITE_OTHER): Payer: Managed Care, Other (non HMO)

## 2017-12-13 ENCOUNTER — Emergency Department (INDEPENDENT_AMBULATORY_CARE_PROVIDER_SITE_OTHER)
Admission: EM | Admit: 2017-12-13 | Discharge: 2017-12-13 | Disposition: A | Discharge status: Home / Self Care | Payer: Managed Care, Other (non HMO) | Source: Home / Self Care | Attending: Family Medicine | Admitting: Family Medicine

## 2017-12-13 ENCOUNTER — Encounter: Payer: Self-pay | Admitting: Emergency Medicine

## 2017-12-13 ENCOUNTER — Other Ambulatory Visit: Payer: Self-pay | Admitting: Osteopathic Medicine

## 2017-12-13 DIAGNOSIS — M5489 Other dorsalgia: Secondary | ICD-10-CM | POA: Diagnosis not present

## 2017-12-13 DIAGNOSIS — R109 Unspecified abdominal pain: Secondary | ICD-10-CM | POA: Diagnosis not present

## 2017-12-13 DIAGNOSIS — M62830 Muscle spasm of back: Secondary | ICD-10-CM

## 2017-12-13 LAB — POCT URINALYSIS DIP (MANUAL ENTRY)
Bilirubin, UA: NEGATIVE
Glucose, UA: NEGATIVE mg/dL
Ketones, POC UA: NEGATIVE mg/dL
Leukocytes, UA: NEGATIVE
Nitrite, UA: NEGATIVE
Protein Ur, POC: NEGATIVE mg/dL
Spec Grav, UA: 1.025 (ref 1.010–1.025)
Urobilinogen, UA: 0.2 E.U./dL
pH, UA: 5.5 (ref 5.0–8.0)

## 2017-12-13 MED ORDER — METHOCARBAMOL 500 MG PO TABS: 500.0000 mg | ORAL_TABLET | Freq: Two times a day (BID) | ORAL | 0 refills | Status: DC

## 2017-12-13 NOTE — Telephone Encounter (Signed)
[facility] requesting med RF for predinisone. Pls advise if RF is appropriate. Thanks.

## 2017-12-13 NOTE — ED Provider Notes (Signed)
[facility]    CSN: [id number] Arrival date & time: 12/13/17  0809     History   Chief Complaint Chief Complaint  Patient presents with  . Back Pain    HPI Teresa Kelley is a 57 y.o. female.   HPI Teresa Kelley is a 57 y.o. female presenting to UC with c/o constant mid to lower back pain for a few months but worse over the last 3 days.  Pain is a constant throbbing.  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  Pain is moderate to severe. Minimal relief with Extra Strength Tylenol.  Denies radiation of pain or numbness down her legs. No urinary symptoms but does question if it is due to her kidneys. She had similar pain start in March of this year. She was prescribed antibiotics for her possible kidneys but urine came back normal so she was advised to not take the antibiotic.  Pain has been intermittent since then. No hx of kidney stones.   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 She does take prednisone 5mg  daily and Plaquenil.  She saw her rheumatologist on 11/30/17 for hip pain but did not mention her back because it was mild at that time.   Past Medical History:  Diagnosis Date  . Vaginal Pap smear, abnormal     Patient Active Problem List   Diagnosis Date Noted  . Smoking 05/25/2017  . ANA positive 03/02/2017  . Elevated CK 03/02/2017  . Body aches 02/02/2017  . Vitamin D deficiency 04/26/2016  . Rash and nonspecific skin eruption 10/05/2015    Past Surgical History:  Procedure Laterality Date  . COLPOSCOPY      OB History    Gravida  4   Para  3   Term  2   Preterm  1   AB      Living        SAB      TAB      Ectopic      Multiple      Live Births  3            Home Medications    Prior to Admission medications   Medication Sig Start Date End Date Taking? Authorizing Provider  b complex vitamins capsule  Take 1 capsule by mouth daily.    [provider]  buPROPion (WELLBUTRIN XL) 150 MG 24 hr tablet Take 1 tablet (150 mg total) by mouth every morning. Must schedule OV for further refills 09/18/17   Sunnie NielsenAlexander, Natalie, DO  CHANTIX CONTINUING MONTH PAK 1 MG tablet TAKE 1 TABLET BY MOUTH TWICE A DAY 08/16/17   Sunnie NielsenAlexander, Natalie, DO  clobetasol ointment (TEMOVATE) 0.05 % Apply 1 application topically 2 (two) times daily. To affected area(s) as needed, max 2 weeks to avoid whitening/thinning skin 04/21/16   Sunnie NielsenAlexander, Natalie, DO  hydroxychloroquine (PLAQUENIL) 200 MG tablet TAKE 1 TABLET BY MOUTH EVERY DAY 08/22/17   Sunnie NielsenAlexander, Natalie, DO  loratadine (CLARITIN) 10 MG tablet Take 1 tablet (10 mg total) by mouth daily. 04/21/16   Sunnie NielsenAlexander, Natalie, DO  meloxicam (MOBIC) 15 MG tablet Take 1 tablet (15 mg total) daily by mouth. 02/20/17   Rodolph Bongorey, Evan S, MD  methocarbamol (ROBAXIN) 500 MG tablet Take 1 tablet (500 mg total) by mouth 2 (two) times daily. 12/13/17   Lurene ShadowPhelps, Geraldin Habermehl O, PA-C  metroNIDAZOLE (METROGEL) 0.75 % gel Apply 1 application topically 2 (two) times daily. 02/02/17   Rodolph Bong, MD  nitrofurantoin, macrocrystal-monohydrate, (MACROBID) 100 MG capsule Take 1 capsule (100 mg total) by mouth 2 (two) times daily. 06/22/17   Sunnie Nielsen, DO  omeprazole (PRILOSEC) 40 MG capsule Take 1 capsule (40 mg total) daily by mouth. 03/02/17   Rodolph Bong, MD  predniSONE (DELTASONE) 5 MG tablet TAKE 2 TABLETS (10 MG TOTAL) BY MOUTH DAILY WITH BREAKFAST. 08/28/17   Sunnie Nielsen, DO  Vitamin D, Ergocalciferol, (DRISDOL) 50000 units CAPS capsule Take 1 capsule (50,000 Units total) by mouth every 7 (seven) days. Take for 8 total doses(weeks) and then plan to recheck levels 04/26/16   Sunnie Nielsen, DO    Family History Family History  Problem Relation Age of Onset  . Cancer Father   . Colon cancer Father     Social History Social History   Tobacco Use  . Smoking status: Current Every Day  Smoker    Packs/day: 1.00    Years: 30.00    Pack years: 30.00    Types: Cigarettes  . Smokeless tobacco: Never Used  Substance Use Topics  . Alcohol use: No  . Drug use: No     Allergies   Amoxicillin   Review of Systems Review of Systems  Musculoskeletal: Positive for back pain and myalgias. Negative for arthralgias.     Physical Exam Triage Vital Signs ED Triage Vitals  Enc Vitals Group     BP 12/13/17 0823 136/81     Pulse Rate 12/13/17 0823 68     Resp --      Temp 12/13/17 0823 98.5 F (36.9 C)     Temp Source 12/13/17 0823 Oral     SpO2 12/13/17 0823 98 %     Weight 12/13/17 0824 149 lb (67.6 kg)     Height --      Head Circumference --      Peak Flow --      Pain Score 12/13/17 0823 7     Pain Loc --      Pain Edu? --      Excl. in GC? --    No data found.  Updated Vital Signs BP 136/81 (BP Location: Right Arm)   Pulse 68   Temp 98.5 F (36.9 C) (Oral)   Wt 149 lb (67.6 kg)   SpO2 98%   BMI 25.58 kg/m   Visual Acuity Right Eye Distance:   Left Eye Distance:   Bilateral Distance:    Right Eye Near:   Left Eye Near:    Bilateral Near:     Physical Exam  Constitutional: She is oriented to person, place, and time. She appears well-developed and well-nourished. No distress.  HENT:  Head: Normocephalic and atraumatic.  Eyes: EOM are normal.  Neck: Normal range of motion.  Cardiovascular: Normal rate.  Pulmonary/Chest: Effort normal.  Musculoskeletal: Normal range of motion. She exhibits tenderness. She exhibits no edema.  No midline spinal tenderness to step-offs. Tenderness to bilateral mid to lower thoracic and upper lumbar muscles.  Full ROM upper and lower extremities bilaterally.   Neurological: She is alert and oriented to person, place, and time.  Skin: Skin is warm and dry. No rash noted. She is not diaphoretic. No erythema.  Psychiatric: She has a normal mood and affect. Her behavior is normal.  Nursing note and vitals  reviewed.    UC Treatments / Results  Labs (all labs  ordered are listed, but only abnormal results are displayed) Labs Reviewed  POCT URINALYSIS DIP (MANUAL ENTRY) - Abnormal; Notable for the following components:      Result Value   Blood, UA small (*)    All other components within normal limits    EKG None  Radiology Dg Thoracic Spine W/swimmers  Result Date: 12/13/2017 CLINICAL DATA:  Mid back pain for the past 3 days.  No known injury. EXAM: THORACIC SPINE - 3 VIEWS COMPARISON:  None. FINDINGS: The thoracic vertebral bodies are preserved in height. There is gentle curvature convex toward the right centered at the thoracolumbar junction. The disc space heights are reasonably well-maintained. The pedicles appear appropriately positioned. There are no abnormal soft tissue densities. The interstitial markings of both lungs are increased. There is calcification in the wall of the aortic arch. IMPRESSION: There is no acute or significant chronic bony abnormality of the thoracic spine. Mild dextrocurvature centered at the thoracolumbar junction may be chronic or could be acute and secondary to muscle spasm. Thoracic aortic atherosclerosis. Coarse interstitial lung markings bilaterally may reflect chronic fibrosis or less likely acute interstitial pneumonia. Electronically Signed   By: David  [place] M.D.   On: 12/13/2017 09:11   Dg Lumbar Spine Complete  Result Date: 12/13/2017 CLINICAL DATA:  Mid back pain for the past 3 days with no known injury. EXAM: LUMBAR SPINE - COMPLETE 4+ VIEW COMPARISON:  Thoracic spine series of today's date. FINDINGS: There is gentle curvature convex toward the right centered at the thoracolumbar junction. The lumbar vertebral bodies are preserved in height. The disc space heights are well maintained. There is no spondylolisthesis. There is no significant facet joint hypertrophy. The pedicles and transverse processes are grossly normal. IMPRESSION: Gentle  dextrocurvature centered at the thoracolumbar junction may reflect muscle spasm. No acute or chronic bony abnormality. No significant disc space height loss. Electronically Signed   By: David  [place] M.D.   On: 12/13/2017 09:13    Procedures Procedures (including critical care time)  Medications Ordered in UC Medications - No data to display  Initial Impression / Assessment and Plan / UC Course  I have reviewed the triage vital signs and the nursing notes.  Pertinent labs & imaging results that were available during my care of the patient were reviewed by me and considered in my medical decision making (see chart for details).     Hx and exam c/w muscle spasm of back. Encouraged home exercises (pt info packet provided). Will tx with Robaxin Encouraged f/u with PCP and/or rheumatology.   Final Clinical Impressions(s) / UC Diagnoses   Final diagnoses:  Flank pain  Back muscle spasm     Discharge Instructions      Robaxin (methocarbamol) is a muscle relaxer and may cause drowsiness. Do not drink alcohol, drive, or operate heavy machinery while taking.  Please schedule a follow up appointment with family medicine or rheumatologist for recheck of symptoms if not improving with the robaxin and home exercises.     ED Prescriptions    Medication Sig Dispense Auth. Provider   methocarbamol (ROBAXIN) 500 MG tablet Take 1 tablet (500 mg total) by mouth 2 (two) times daily. 20 tablet Lurene Shadow, PA-C     Controlled Substance Prescriptions [place] Controlled Substance Registry consulted? Not Applicable   Rolla Plate 12/13/17 1024

## 2017-12-13 NOTE — ED Triage Notes (Signed)
Pt c/o constant back pain for awhile but worse the last 3 days. Pain is a constant throbbing. Denies injury. No urinary sxs. Tylenol helps.

## 2017-12-13 NOTE — Discharge Instructions (Signed)
°  Robaxin (methocarbamol) is a muscle relaxer and may cause drowsiness. Do not drink alcohol, drive, or operate heavy machinery while taking.  Please schedule a follow up appointment with family medicine or rheumatologist for recheck of symptoms if not improving with the robaxin and home exercises.

## 2017-12-14 ENCOUNTER — Ambulatory Visit: Payer: Managed Care, Other (non HMO) | Admitting: Osteopathic Medicine

## 2017-12-14 NOTE — Telephone Encounter (Signed)
Left a detailed vm msg for pt regarding refused med RF. Direct call back information provided.

## 2017-12-25 ENCOUNTER — Encounter: Payer: Self-pay | Admitting: Osteopathic Medicine

## 2017-12-25 ENCOUNTER — Ambulatory Visit: Payer: Managed Care, Other (non HMO) | Admitting: Osteopathic Medicine

## 2017-12-25 DIAGNOSIS — M545 Low back pain, unspecified: Secondary | ICD-10-CM

## 2017-12-25 DIAGNOSIS — R768 Other specified abnormal immunological findings in serum: Secondary | ICD-10-CM

## 2017-12-25 DIAGNOSIS — Z0000 Encounter for general adult medical examination without abnormal findings: Secondary | ICD-10-CM

## 2017-12-25 DIAGNOSIS — E559 Vitamin D deficiency, unspecified: Secondary | ICD-10-CM

## 2017-12-25 DIAGNOSIS — R319 Hematuria, unspecified: Secondary | ICD-10-CM

## 2017-12-25 DIAGNOSIS — G8929 Other chronic pain: Secondary | ICD-10-CM

## 2017-12-25 DIAGNOSIS — R002 Palpitations: Secondary | ICD-10-CM

## 2017-12-25 DIAGNOSIS — R52 Pain, unspecified: Secondary | ICD-10-CM

## 2017-12-25 DIAGNOSIS — Q798 Other congenital malformations of musculoskeletal system: Secondary | ICD-10-CM

## 2017-12-25 DIAGNOSIS — I700 Atherosclerosis of aorta: Secondary | ICD-10-CM

## 2017-12-25 DIAGNOSIS — M25551 Pain in right hip: Secondary | ICD-10-CM

## 2017-12-25 MED ORDER — METHOCARBAMOL 500 MG PO TABS: 500.0000 mg | ORAL_TABLET | Freq: Three times a day (TID) | ORAL | 2 refills | Status: DC | PRN

## 2017-12-25 MED ORDER — MELOXICAM 15 MG PO TABS: 15.0000 mg | ORAL_TABLET | Freq: Every day | ORAL | 2 refills | Status: DC

## 2017-12-25 NOTE — Progress Notes (Signed)
HPI: Teresa Kelley is a 57 y.o. female who  has a past medical history of Vaginal Pap smear, abnormal.  she presents to [facility] today, 12/25/17,  for chief complaint of:  Urgent care f/u - back/flank pain   Seen in urgent care 12 days ago, 12/13/2017, complaint of mid/lower back pain x2 months but worse x3 days, throbbing, no known injury.  X-rays of thoracic and lumbar spine showed mild dextrocurvature centered at the thoracolumbar junction, may reflect muscle spasm, but otherwise no concerns.  UA showed small blood.  Treated for muscle spasm: encouraged home exercises (she has not done these), prescription for Robaxin provided, encourage follow-up with PCP/rheumatology.  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  Saw rheumatology 11/30/2017 but did not mention back pain because it was mild at that time -possible lip biopsy was discussed, prednisone burst was prescribed for hip pain.  Patient is here today to recheck the above issues. She reports feeling fine today as far as back pain, but has some concerns about thoracic aortic atherosclerosis seen on XR, occasional palpitations few times a month usually at night, no CP on exertion, no SOB on exertion. Former smoker - quit this May 2019.       Past medical history, surgical history, and family history reviewed.  Current medication list and allergy/intolerance information reviewed.   (See remainder of HPI, ROS, Phys Exam below)  EKG interpretation: Rate: 55 Rhythm: sinus No ST/T changes concerning for acute ischemia/infarct  Previous EKG no tracings available      ASSESSMENT/PLAN: Diagnoses of Connective tissue anomaly, ANA positive, Body aches, Chronic bilateral low back pain without sciatica, Hematuria, unspecified type, Chronic right hip pain, Vitamin D deficiency, Palpitations, and  Thoracic aorta atherosclerosis (HCC) were pertinent to this visit.   Note: labs ordered for future annual physical - preventive care was not performed or billed today  See pt instructions    Meds ordered this encounter  Medications  . meloxicam (MOBIC) 15 MG tablet    Sig: Take 1 tablet (15 mg total) by mouth daily. As needed for aches/pains    Dispense:  30 tablet    Refill:  2  . methocarbamol (ROBAXIN) 500 MG tablet    Sig: Take 1 tablet (500 mg total) by mouth every 8 (eight) hours as needed for muscle spasms.    Dispense:  30 tablet    Refill:  2    Patient Instructions  Plan:  For palpitations: symptoms more consistent with anxiety or breathing issue/lung damage from smoking rather than heart problem, but given the atherosclerosis of the thoracic aorta we can infer that there is some blood vessel damage from smoking +/- high cholesterol. Will check lab work. EKG today looks fine.   Your connective tissue disorder may also predispose to blood vessel problems such as thoracic aortic atherosclerosis. I think we should consult with a cardiologist to see if you may benefit from other screening for this such as stress test or imaging. You should hear back about setting up an appointment.   For hip pain, recommend follow up with Dr Denyse Amass to see if injection or further imaging might be helpful. I refilled the Robaxin (muscle relaxer) and the Meloxicam (anti-inflammatory) to use as needed.    Follow-up plan: Return for consult w/ Dr Denyse Amass for hip pain, cardiologist for atherosclerosis/palpitations, Dr A 4-6 wk annual.            ############################################ ############################################ ############################################ ############################################  Outpatient Encounter Medications as of 12/25/2017  Medication Sig  . b complex vitamins capsule Take 1 capsule by mouth daily.  Marland Kitchen buPROPion (WELLBUTRIN XL) 150 MG  24 hr tablet Take 1 tablet (150 mg total) by mouth every morning. Must schedule OV for further refills  . CHANTIX CONTINUING MONTH PAK 1 MG tablet TAKE 1 TABLET BY MOUTH TWICE A DAY  . clobetasol ointment (TEMOVATE) 0.05 % Apply 1 application topically 2 (two) times daily. To affected area(s) as needed, max 2 weeks to avoid whitening/thinning skin  . hydroxychloroquine (PLAQUENIL) 200 MG tablet TAKE 1 TABLET BY MOUTH EVERY DAY  . loratadine (CLARITIN) 10 MG tablet Take 1 tablet (10 mg total) by mouth daily.  . meloxicam (MOBIC) 15 MG tablet Take 1 tablet (15 mg total) daily by mouth.  . methocarbamol (ROBAXIN) 500 MG tablet Take 1 tablet (500 mg total) by mouth 2 (two) times daily.  . metroNIDAZOLE (METROGEL) 0.75 % gel Apply 1 application topically 2 (two) times daily.  . nitrofurantoin, macrocrystal-monohydrate, (MACROBID) 100 MG capsule Take 1 capsule (100 mg total) by mouth 2 (two) times daily.  Marland Kitchen omeprazole (PRILOSEC) 40 MG capsule Take 1 capsule (40 mg total) daily by mouth.  . predniSONE (DELTASONE) 5 MG tablet TAKE 2 TABLETS (10 MG TOTAL) BY MOUTH DAILY WITH BREAKFAST.  Marland Kitchen Vitamin D, Ergocalciferol, (DRISDOL) 50000 units CAPS capsule Take 1 capsule (50,000 Units total) by mouth every 7 (seven) days. Take for 8 total doses(weeks) and then plan to recheck levels   No facility-administered encounter medications on file as of 12/25/2017.    Allergies  Allergen Reactions  . Amoxicillin Nausea And Vomiting      Review of Systems:  Constitutional: No recent illness  HEENT: No  headache, no vision change  Cardiac: No  chest pain, No  pressure, +palpitations  Respiratory:  No  shortness of breath. No  Cough  Gastrointestinal: No  abdominal pain  Musculoskeletal: No new myalgia/arthralgia, +chronic R hip pain   Neurologic: No  weakness, No  Dizziness  Psychiatric: No  concerns with depression, No  concerns with anxiety  Exam:  BP 125/75 (BP Location: Left Arm, Patient Position:  Sitting, Cuff Size: Normal)   Pulse 65   Temp 98.2 F (36.8 C) (Oral)   Wt 148 lb 6.4 oz (67.3 kg)   BMI 25.47 kg/m   Constitutional: VS see above. General Appearance: alert, well-developed, well-nourished, NAD  Eyes: Normal lids and conjunctive, non-icteric sclera  Ears, Nose, Mouth, Throat: MMM, Normal external inspection ears/nares/mouth/lips/gums.  Neck: No masses, trachea midline.   Respiratory: Normal respiratory effort. no wheeze, no rhonchi, no rales  Cardiovascular: S1/S2 normal, +faint systolic murmur, no rub/gallop auscultated. RRR.   Musculoskeletal: Gait normal. Symmetric and independent movement of all extremities. Tenderness just to posterior greater trochanter R hip  Neurological: Normal balance/coordination. No tremor.  Skin: warm, dry, intact.   Psychiatric: Normal judgment/insight. Normal mood and affect. Oriented x3.   Visit summary with medication list and pertinent instructions was printed for patient to review, advised to alert [place] if any changes needed. All questions at time of visit were answered - patient instructed to contact office with any additional concerns. ER/RTC precautions were reviewed with the patient and understanding verbalized.   Follow-up plan: Return for consult w/ Dr Denyse Amass for hip pain, cardiologist for atherosclerosis/palpitations, Dr A 4-6 wk annual.   Please note: voice recognition software was used to produce this document, and typos may escape review. Please contact Dr. Lyn Hollingshead for  any needed clarifications.

## 2017-12-25 NOTE — Patient Instructions (Addendum)
Plan:  For palpitations: symptoms more consistent with anxiety or breathing issue/lung damage from smoking rather than heart problem, but given the atherosclerosis of the thoracic aorta we can infer that there is some blood vessel damage from smoking +/- high cholesterol. Will check lab work. EKG today looks fine.   Your connective tissue disorder may also predispose to blood vessel problems such as thoracic aortic atherosclerosis. I think we should consult with a cardiologist to see if you may benefit from other screening for this such as stress test or imaging. You should hear back about setting up an appointment.   For hip pain, recommend follow up with Dr Denyse Amass to see if injection or further imaging might be helpful. I refilled the Robaxin (muscle relaxer) and the Meloxicam (anti-inflammatory) to use as needed.

## 2017-12-26 ENCOUNTER — Ambulatory Visit: Payer: Managed Care, Other (non HMO) | Admitting: Family Medicine

## 2017-12-26 ENCOUNTER — Encounter: Payer: Self-pay | Admitting: Family Medicine

## 2017-12-26 VITALS — BP 114/64 | HR 67 | Ht 64.0 in | Wt 149.0 lb

## 2017-12-26 DIAGNOSIS — M359 Systemic involvement of connective tissue, unspecified: Secondary | ICD-10-CM | POA: Diagnosis not present

## 2017-12-26 DIAGNOSIS — M7061 Trochanteric bursitis, right hip: Secondary | ICD-10-CM

## 2017-12-26 HISTORY — DX: Systemic involvement of connective tissue, unspecified: M35.9

## 2017-12-26 NOTE — Patient Instructions (Signed)
Thank you for coming in today.  Attend PT.  Do the exercises.  Side leg raises 30 reps 2-3x daily.  Cross over stretch Figure 4 stretch Stand IT band stretch.   Use a heating pad as needed.   Recheck in 4 weeks.  Next step is injection.    Trochanteric Bursitis Rehab Ask your health care provider which exercises are safe for you. Do exercises exactly as told by your health care provider and adjust them as directed. It is normal to feel mild stretching, pulling, tightness, or discomfort as you do these exercises, but you should stop right away if you feel sudden pain or your pain gets worse.Do not begin these exercises until told by your health care provider. Stretching exercises These exercises warm up your muscles and joints and improve the movement and flexibility of your hip. These exercises also help to relieve pain and stiffness. Exercise A: Iliotibial band stretch  1. Lie on your side with your left / right leg in the top position. 2. Bend your left / right knee and grab your ankle. 3. Slowly bring your knee back so your thigh is behind your body. 4. Slowly lower your knee toward the floor until you feel a gentle stretch on the outside of your left / right thigh. If you do not feel a stretch and your knee will not fall farther, place the heel of your other foot on top of your outer knee and pull your thigh down farther. 5. Hold this position for __________ seconds. 6. Slowly return to the starting position. Repeat __________ times. Complete this exercise __________ times a day. Strengthening exercises These exercises build strength and endurance in your hip and pelvis. Endurance is the ability to use your muscles for a long time, even after they get tired. Exercise B: Bridge ( hip extensors) 1. Lie on your back on a firm surface with your knees bent and your feet flat on the floor. 2. Tighten your buttocks muscles and lift your buttocks off the floor until your trunk is level  with your thighs. You should feel the muscles working in your buttocks and the back of your thighs. If this exercise is too easy, try doing it with your arms crossed over your chest. 3. Hold this position for __________ seconds. 4. Slowly return to the starting position. 5. Let your muscles relax completely between repetitions. Repeat __________ times. Complete this exercise __________ times a day. Exercise C: Squats ( knee extensors and  quadriceps) 1. Stand in front of a table, with your feet and knees pointing straight ahead. You may rest your hands on the table for balance but not for support. 2. Slowly bend your knees and lower your hips like you are going to sit in a chair. ? Keep your weight over your heels, not over your toes. ? Keep your lower legs upright so they are parallel with the table legs. ? Do not let your hips go lower than your knees. ? Do not bend lower than told by your health care provider. ? If your hip pain increases, do not bend as low. 3. Hold this position for __________ seconds. 4. Slowly push with your legs to return to standing. Do not use your hands to pull yourself to standing. Repeat __________ times. Complete this exercise __________ times a day. Exercise D: Hip hike 1. Stand sideways on a bottom step. Stand on your left / right leg with your other foot unsupported next to the step. You can  hold onto the railing or wall if needed for balance. 2. Keeping your knees straight and your torso square, lift your left / right hip up toward the ceiling. 3. Hold this position for __________ seconds. 4. Slowly let your left / right hip lower toward the floor, past the starting position. Your foot should get closer to the floor. Do not lean or bend your knees. Repeat __________ times. Complete this exercise __________ times a day. Exercise E: Single leg stand 1. Stand near a counter or door frame that you can hold onto for balance as needed. It is helpful to stand in  front of a mirror for this exercise so you can watch your hip. 2. Squeeze your left / right buttock muscles then lift up your other foot. Do not let your left / right hip push out to the side. 3. Hold this position for __________ seconds. Repeat __________ times. Complete this exercise __________ times a day. This information is not intended to replace advice given to you by your health care provider. Make sure you discuss any questions you have with your health care provider. Document Released: 05/12/2004 Document Revised: 12/10/2015 Document Reviewed: 03/20/2015 Elsevier Interactive Patient Education  [employer].

## 2017-12-26 NOTE — Progress Notes (Signed)
Teresa Kelley is a 57 y.o. female who presents to [facility] today for right hip pain.  Francena notes a 2-year history of right lateral hip pain.  This occurred after she fell.  She had pain off and on since the last 2 years.  She notes recently the pain is worsened.  She denies any recent injury.  She has pain in the lateral hip worse when laying on her right side standing from a seated position prolonged standing walking or climbing stairs.  She notes the pain will sometimes wake her from night.  She has a pertinent past medical history for nonspecific connective tissue disease managed otology with Plaquenil and prednisone.  She notes her rheumatologist ordered a hip x-ray and August which was normal.  Notes the pain is quite severe at times and sometimes interferes with her ability to work.    ROS:  As above  Exam:  BP 114/64   Pulse 67   Ht 5\' 4"  (1.626 m)   Wt 149 lb (67.6 kg)   BMI 25.58 kg/m  General: Well Developed, well nourished, and in no acute distress.  Neuro/Psych: Alert and oriented x3, extra-ocular muscles intact, able to move all 4 extremities, sensation grossly intact. Skin: Warm and dry, no rashes noted.  Respiratory: Not using accessory muscles, speaking in full sentences, trachea midline.  Cardiovascular: Pulses palpable, no extremity edema. Abdomen: Does not appear distended. MSK:  Right hip normal-appearing normal motion. Tender palpation greater trochanter. Hip abduction strength diminished 4/5.  Pain significantly present and reproduced with hip abduction strength testing. Antalgic gait. No significant leg length discrepancy.    Lab and Radiology Results Xr Hip Min 2 Views Right8/16/2019 [facility] Result Impression  IMPRESSION: Unremarkable right hip.  Electronically Signed by: Leane Para  Result Narrative  INDICATION: Pain  COMPARISON: None  TECHNIQUE: 2 views right hip  FINDINGS: Right  hip joint is well-maintained. No acute fractures or subluxations. Pelvic phleboliths.   I personally (independently) visualized and performed the interpretation of the images attached in this note.     Assessment and Plan: 57 y.o. female with  Right lateral hip pain is trochanteric bursitis/hip abductor tendinitis.  Plan for physical therapy home exercise program and heating pad and continued over-the-counter medicines for pain.  Discussed trochanter injection.  Will defer that for today and perform that the future if needed.  Recheck in 4 weeks.  New issue today uncertain prognosis.  Connective tissue disease: Doing reasonably well continue current medication and management with rheumatology.  Orders Placed This Encounter  Procedures  . Ambulatory referral to Physical Therapy    Referral Priority:   Routine    Referral Type:   Physical Medicine    Referral Reason:   Specialty Services Required    Requested Specialty:   Physical Therapy   No orders of the defined types were placed in this encounter.   Historical information moved to improve visibility of documentation.  Past Medical History:  Diagnosis Date  . 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  ANA positive.  . Vaginal Pap smear, abnormal    Past Surgical History:  Procedure Laterality Date  . COLPOSCOPY     Social History   Tobacco Use  . Smoking status: Former Smoker    Packs/day: 1.00    Years: 30.00    Pack years: 30.00    Types: Cigarettes    Last attempt to quit: 08/17/2017  Years since quitting: 0.3  . Smokeless tobacco: Never Used  Substance Use Topics  . Alcohol use: No   family history includes Cancer in her father; Colon cancer in her father.  Medications: Current Outpatient Medications  Medication Sig Dispense Refill  . b complex vitamins capsule Take 1 capsule by mouth daily.    . hydroxychloroquine (PLAQUENIL) 200 MG tablet TAKE 1 TABLET BY  MOUTH EVERY DAY 90 tablet 0  . meloxicam (MOBIC) 15 MG tablet Take 1 tablet (15 mg total) by mouth daily. As needed for aches/pains 30 tablet 2  . methocarbamol (ROBAXIN) 500 MG tablet Take 1 tablet (500 mg total) by mouth every 8 (eight) hours as needed for muscle spasms. 30 tablet 2  . predniSONE (DELTASONE) 5 MG tablet TAKE 2 TABLETS (10 MG TOTAL) BY MOUTH DAILY WITH BREAKFAST. 180 tablet 0  . Vitamin D, Ergocalciferol, (DRISDOL) 50000 units CAPS capsule Take 1 capsule (50,000 Units total) by mouth every 7 (seven) days. Take for 8 total doses(weeks) and then plan to recheck levels 8 capsule 0   No current facility-administered medications for this visit.    Allergies  Allergen Reactions  . Amoxicillin Nausea And Vomiting      Discussed warning signs or symptoms. Please see discharge instructions. Patient expresses understanding.

## 2018-01-09 ENCOUNTER — Encounter: Payer: Self-pay | Admitting: Cardiology

## 2018-01-18 NOTE — Progress Notes (Deleted)
Referring-Teresa Lyn Hollingshead, DO Reason for referral-palpitations  HPI: 57 year old female for evaluation of palpitations at request of Sunnie Nielsen, DO.  Also for evaluation of atherosclerosis thoracic spine images 2019 showed thoracic aortic atherosclerosis.  Current Outpatient Medications  Medication Sig Dispense Refill  . b complex vitamins capsule Take 1 capsule by mouth daily.    . hydroxychloroquine (PLAQUENIL) 200 MG tablet TAKE 1 TABLET BY MOUTH EVERY DAY 90 tablet 0  . meloxicam (MOBIC) 15 MG tablet Take 1 tablet (15 mg total) by mouth daily. As needed for aches/pains 30 tablet 2  . methocarbamol (ROBAXIN) 500 MG tablet Take 1 tablet (500 mg total) by mouth every 8 (eight) hours as needed for muscle spasms. 30 tablet 2  . predniSONE (DELTASONE) 5 MG tablet TAKE 2 TABLETS (10 MG TOTAL) BY MOUTH DAILY WITH BREAKFAST. 180 tablet 0  . Vitamin D, Ergocalciferol, (DRISDOL) 50000 units CAPS capsule Take 1 capsule (50,000 Units total) by mouth every 7 (seven) days. Take for 8 total doses(weeks) and then plan to recheck levels 8 capsule 0   No current facility-administered medications for this visit.     Allergies  Allergen Reactions  . Amoxicillin Nausea And Vomiting    Past Medical History:  Diagnosis Date  . 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  ANA positive.  . Vaginal Pap smear, abnormal     Past Surgical History:  Procedure Laterality Date  . COLPOSCOPY      Social History   Socioeconomic History  . Marital status: Single    Spouse name: Not on file  . Number of children: Not on file  . Years of education: Not on file  . Highest education level: Not on file  Occupational History  . Not on file  Social Needs  . Financial resource strain: Not on file  . Food insecurity:    Worry: Not on file    Inability: Not on file  . Transportation needs:    Medical: Not on file    Non-medical: Not on file    Tobacco Use  . Smoking status: Former Smoker    Packs/day: 1.00    Years: 30.00    Pack years: 30.00    Types: Cigarettes    Last attempt to quit: 08/17/2017    Years since quitting: 0.4  . Smokeless tobacco: Never Used  Substance and Sexual Activity  . Alcohol use: No  . Drug use: No  . Sexual activity: Not Currently    Birth control/protection: Abstinence  Lifestyle  . Physical activity:    Days per week: Not on file    Minutes per session: Not on file  . Stress: Not on file  Relationships  . Social connections:    Talks on phone: Not on file    Gets together: Not on file    Attends religious service: Not on file    Active member of club or organization: Not on file    Attends meetings of clubs or organizations: Not on file    Relationship status: Not on file  . Intimate partner violence:    Fear of current or ex partner: Not on file    Emotionally abused: Not on file    Physically abused: Not on file    Forced sexual activity: Not on file  Other Topics Concern  . Not on file  Social History Narrative  . Not on file    Family History  Problem Relation Age of Onset  .  Cancer Father   . Colon cancer Father     ROS: no fevers or chills, productive cough, hemoptysis, dysphasia, odynophagia, melena, hematochezia, dysuria, hematuria, rash, seizure activity, orthopnea, PND, pedal edema, claudication. Remaining systems are negative.  Physical Exam:   There were no vitals taken for this visit.  General:  Well developed/well nourished in NAD Skin warm/dry Patient not depressed No peripheral clubbing Back-normal HEENT-normal/normal eyelids Neck supple/normal carotid upstroke bilaterally; no bruits; no JVD; no thyromegaly chest - CTA/ normal expansion CV - RRR/normal S1 and S2; no murmurs, rubs or gallops;  PMI nondisplaced Abdomen -NT/ND, no HSM, no mass, + bowel sounds, no bruit 2+ femoral pulses, no bruits Ext-no edema, chords, 2+ DP Neuro-grossly  nonfocal  ECG -December 25, 2017-sinus bradycardia with no ST changes.  Personally reviewed  A/P  1  Olga Millers, MD

## 2018-01-23 ENCOUNTER — Ambulatory Visit: Payer: Managed Care, Other (non HMO) | Admitting: Family Medicine

## 2018-01-23 ENCOUNTER — Encounter: Payer: Self-pay | Admitting: Family Medicine

## 2018-01-23 VITALS — BP 111/71 | HR 62 | Ht 65.0 in | Wt 148.0 lb

## 2018-01-23 DIAGNOSIS — M7061 Trochanteric bursitis, right hip: Secondary | ICD-10-CM

## 2018-01-23 DIAGNOSIS — M359 Systemic involvement of connective tissue, unspecified: Secondary | ICD-10-CM

## 2018-01-23 MED ORDER — DICLOFENAC SODIUM 1 % TD GEL: 4.0000 g | Freq: Four times a day (QID) | TRANSDERMAL | 11 refills | Status: AC

## 2018-01-23 NOTE — Progress Notes (Signed)
Teresa Kelley is a 57 y.o. female who presents to [facility] today for right lateral hip pain.   Teresa Kelley was seem month ago for right lateral hip pain thought to be trochanteric .  She had a remote history of a fall about 2 years ago and had some continuing pain since.  Her pain had worsened recently and she was asked to see me for her lateral hip pain.  Additionally she has a history of a rheumatologic disease that is not well differentiated at this time managed by rheumatology with Plaquenil.  At the visit she was wanting to avoid or delay in injection and proceeded with home exercise program and physical therapy.  She is been able to do her home exercise program including side leg raises to some degree.  However she was unable to do physical therapy because her mom became ill and she had to care for her mother out of town.  She notes she is continuing to have bothersome right lateral hip pain.  Pain is worse when she climbs stairs stands up from a seated position and lays on her right side.  She notes the pain is quite bothersome and does interfere with her ability to work at times.   With  ROS:  As above  Exam:  BP 111/71   Pulse 62   Ht 5\' 5"  (1.651 m)   Wt 148 lb (67.1 kg)   BMI 24.63 kg/m  General: Well Developed, well nourished, and in no acute distress.  Neuro/Psych: Alert and oriented x3, extra-ocular muscles intact, able to move all 4 extremities, sensation grossly intact. Skin: Warm and dry, no rashes noted.  Respiratory: Not using accessory muscles, speaking in full sentences, trachea midline.  Cardiovascular: Pulses palpable, no extremity edema. Abdomen: Does not appear distended. MSK:  Right hip: Normal-appearing normal motion. Tender palpation at greater trochanter. Hip abduction strength diminished 4/5. Pulses capillary fill sensation are intact.       Assessment and Plan: 57 y.o. female with  Right lateral  hip pain trochanteric bursitis versus hip abductor tendinopathy.  Patient will reestablish with physical therapy and start formal physical therapy.  Additionally will continue home exercise program.  We will plan for trial of diclofenac gel as well.  Offered steroid injection patient declined.  Happy to do that anytime in the future.  Recheck in 4 to 6 weeks.  If not better after adequate trial of physical therapy and conservative management next step would be either injection or MRI.    No orders of the defined types were placed in this encounter.  Meds ordered this encounter  Medications  . diclofenac sodium (VOLTAREN) 1 % GEL    Sig: Apply 4 g topically 4 (four) times daily. To affected joint.    Dispense:  100 g    Refill:  11    Historical information moved to improve visibility of documentation.  Past Medical History:  Diagnosis Date  . 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  ANA positive.  . Vaginal Pap smear, abnormal    Past Surgical History:  Procedure Laterality Date  . COLPOSCOPY     Social History   Tobacco Use  . Smoking status: Former Smoker    Packs/day: 1.00    Years: 30.00    Pack years: 30.00    Types: Cigarettes    Last attempt to quit: 08/17/2017    Years since quitting: 0.4  . Smokeless tobacco: Never  Used  Substance Use Topics  . Alcohol use: No   family history includes Cancer in her father; Colon cancer in her father.  Medications: Current Outpatient Medications  Medication Sig Dispense Refill  . b complex vitamins capsule Take 1 capsule by mouth daily.    . hydroxychloroquine (PLAQUENIL) 200 MG tablet TAKE 1 TABLET BY MOUTH EVERY DAY 90 tablet 0  . meloxicam (MOBIC) 15 MG tablet Take 1 tablet (15 mg total) by mouth daily. As needed for aches/pains 30 tablet 2  . methocarbamol (ROBAXIN) 500 MG tablet Take 1 tablet (500 mg total) by mouth every 8 (eight) hours as needed for muscle spasms. 30 tablet 2   . predniSONE (DELTASONE) 5 MG tablet TAKE 2 TABLETS (10 MG TOTAL) BY MOUTH DAILY WITH BREAKFAST. 180 tablet 0  . Vitamin D, Ergocalciferol, (DRISDOL) 50000 units CAPS capsule Take 1 capsule (50,000 Units total) by mouth every 7 (seven) days. Take for 8 total doses(weeks) and then plan to recheck levels 8 capsule 0  . diclofenac sodium (VOLTAREN) 1 % GEL Apply 4 g topically 4 (four) times daily. To affected joint. 100 g 11   No current facility-administered medications for this visit.    Allergies  Allergen Reactions  . Amoxicillin Nausea And Vomiting      Discussed warning signs or symptoms. Please see discharge instructions. Patient expresses understanding.

## 2018-01-23 NOTE — Patient Instructions (Addendum)
Thank you for coming in today. Reschedule with physical therapy.  If you think you would like an injection ok to come back sooner.  Apply the diclofenac gel to the painful area up to 4x daily.  Recheck in 4-6 weeks. Return sooner if needed.   If you have done PT and are not getting better and planning on coming back let me know ahead of time.  We may just do an MRI before the next visit.   [phone]    Trochanteric Bursitis Rehab Ask your health care provider which exercises are safe for you. Do exercises exactly as told by your health care provider and adjust them as directed. It is normal to feel mild stretching, pulling, tightness, or discomfort as you do these exercises, but you should stop right away if you feel sudden pain or your pain gets worse.Do not begin these exercises until told by your health care provider. Stretching exercises These exercises warm up your muscles and joints and improve the movement and flexibility of your hip. These exercises also help to relieve pain and stiffness. Exercise A: Iliotibial band stretch  1. Lie on your side with your left / right leg in the top position. 2. Bend your left / right knee and grab your ankle. 3. Slowly bring your knee back so your thigh is behind your body. 4. Slowly lower your knee toward the floor until you feel a gentle stretch on the outside of your left / right thigh. If you do not feel a stretch and your knee will not fall farther, place the heel of your other foot on top of your outer knee and pull your thigh down farther. 5. Hold this position for __________ seconds. 6. Slowly return to the starting position. Repeat __________ times. Complete this exercise __________ times a day. Strengthening exercises These exercises build strength and endurance in your hip and pelvis. Endurance is the ability to use your muscles for a long time, even after they get tired. Exercise B: Bridge ( hip extensors) 1. Lie on your back  on a firm surface with your knees bent and your feet flat on the floor. 2. Tighten your buttocks muscles and lift your buttocks off the floor until your trunk is level with your thighs. You should feel the muscles working in your buttocks and the back of your thighs. If this exercise is too easy, try doing it with your arms crossed over your chest. 3. Hold this position for __________ seconds. 4. Slowly return to the starting position. 5. Let your muscles relax completely between repetitions. Repeat __________ times. Complete this exercise __________ times a day. Exercise C: Squats ( knee extensors and  quadriceps) 1. Stand in front of a table, with your feet and knees pointing straight ahead. You may rest your hands on the table for balance but not for support. 2. Slowly bend your knees and lower your hips like you are going to sit in a chair. ? Keep your weight over your heels, not over your toes. ? Keep your lower legs upright so they are parallel with the table legs. ? Do not let your hips go lower than your knees. ? Do not bend lower than told by your health care provider. ? If your hip pain increases, do not bend as low. 3. Hold this position for __________ seconds. 4. Slowly push with your legs to return to standing. Do not use your hands to pull yourself to standing. Repeat __________ times. Complete this exercise  __________ times a day. Exercise D: Hip hike 1. Stand sideways on a bottom step. Stand on your left / right leg with your other foot unsupported next to the step. You can hold onto the railing or wall if needed for balance. 2. Keeping your knees straight and your torso square, lift your left / right hip up toward the ceiling. 3. Hold this position for __________ seconds. 4. Slowly let your left / right hip lower toward the floor, past the starting position. Your foot should get closer to the floor. Do not lean or bend your knees. Repeat __________ times. Complete this  exercise __________ times a day. Exercise E: Single leg stand 1. Stand near a counter or door frame that you can hold onto for balance as needed. It is helpful to stand in front of a mirror for this exercise so you can watch your hip. 2. Squeeze your left / right buttock muscles then lift up your other foot. Do not let your left / right hip push out to the side. 3. Hold this position for __________ seconds. Repeat __________ times. Complete this exercise __________ times a day. This information is not intended to replace advice given to you by your health care provider. Make sure you discuss any questions you have with your health care provider. Document Released: 05/12/2004 Document Revised: 12/10/2015 Document Reviewed: 03/20/2015 Elsevier Interactive Patient Education  [employer].

## 2018-01-24 ENCOUNTER — Ambulatory Visit: Payer: Managed Care, Other (non HMO) | Admitting: Cardiology

## 2018-03-05 ENCOUNTER — Ambulatory Visit: Payer: Managed Care, Other (non HMO) | Admitting: Family Medicine

## 2018-08-16 ENCOUNTER — Other Ambulatory Visit: Payer: Self-pay | Admitting: Osteopathic Medicine

## 2018-08-16 NOTE — Telephone Encounter (Signed)
Can this patient receive a refill on this medication? 

## 2020-02-09 IMAGING — DX DG LUMBAR SPINE COMPLETE 4+V
5 series · 5 of 5 positions shown · non-contrast
Comparison: Thoracic spine series of today's date.

CLINICAL DATA: Mid back pain for the past 3 days with no known
injury.

EXAM:
LUMBAR SPINE - COMPLETE 4+ VIEW

[l-spine ap]
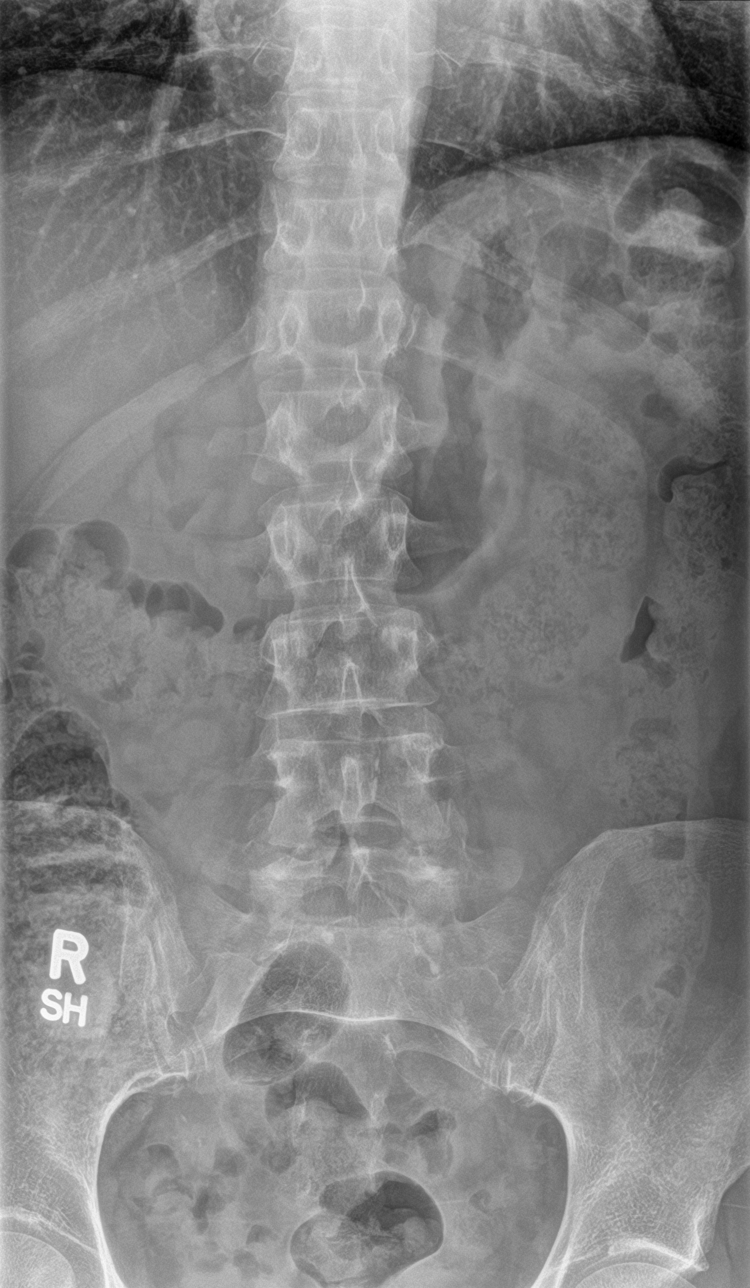

[l-spine obl (1 of 2)]
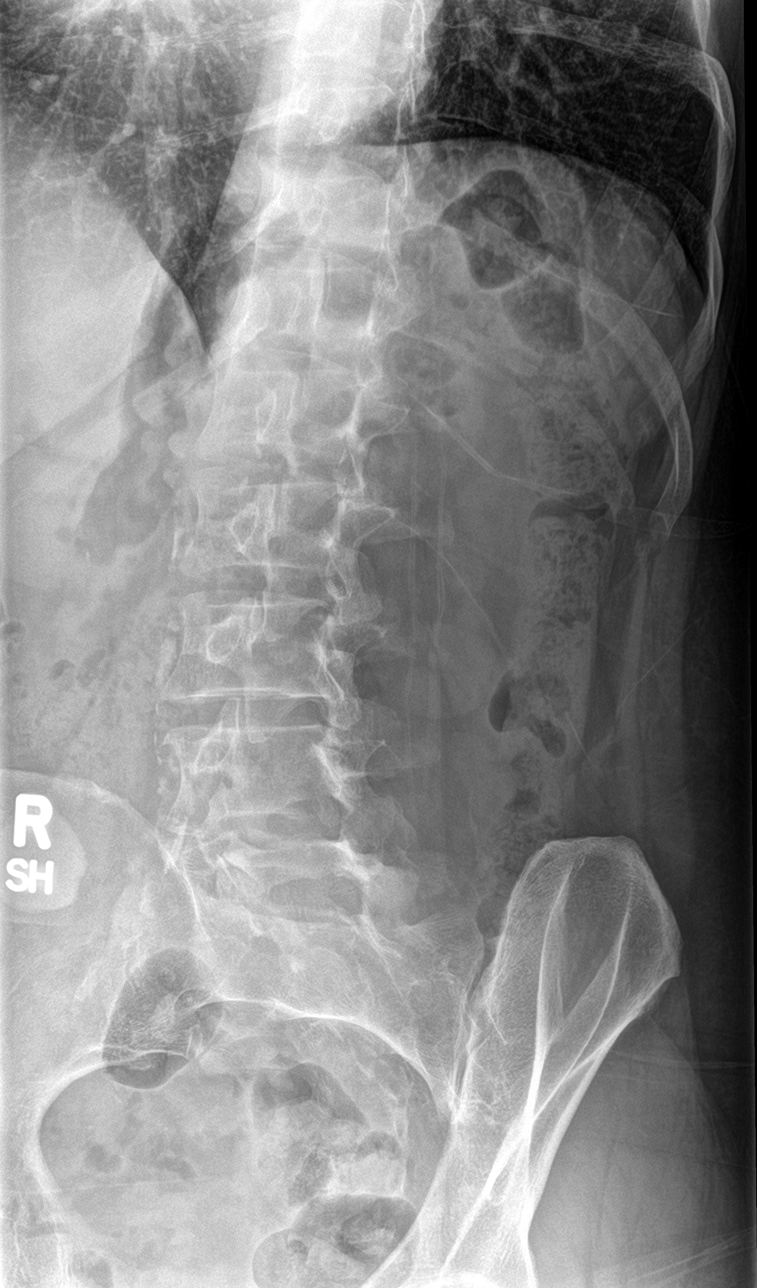

[l-spine obl (2 of 2)]
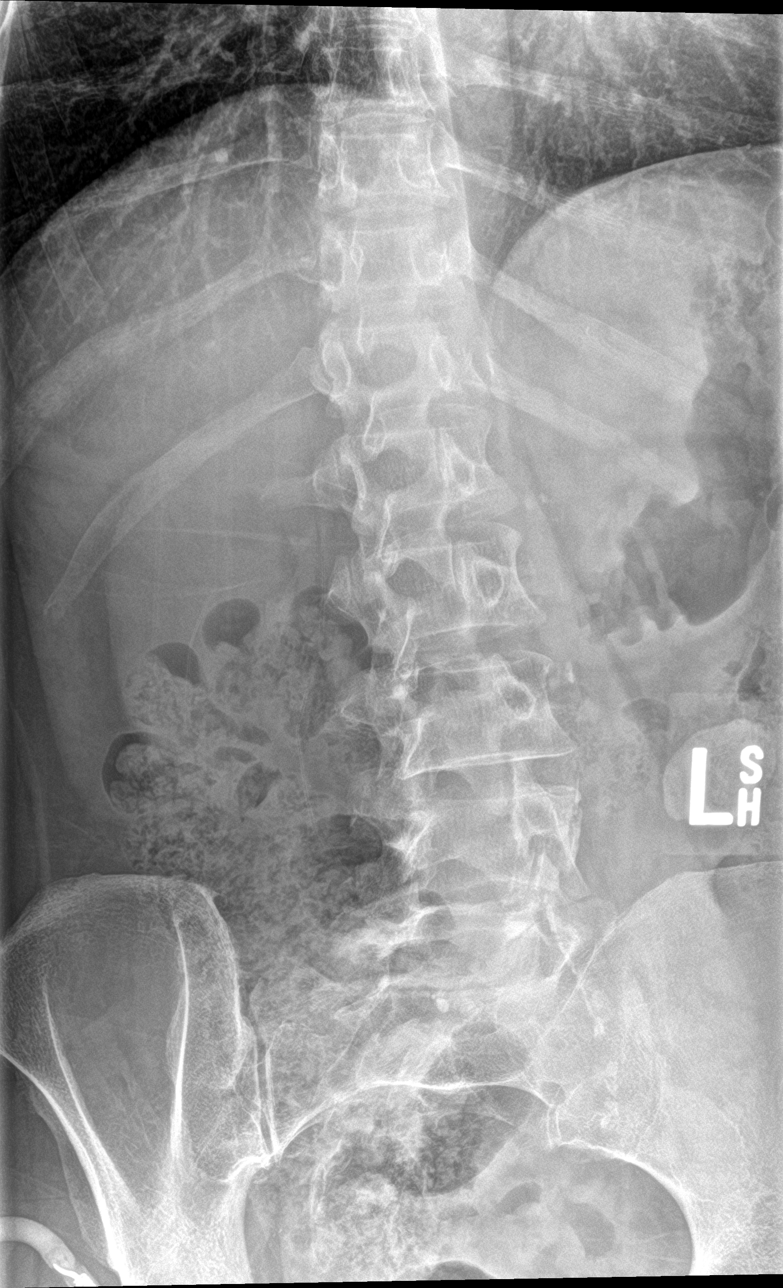

[l-spine lat]
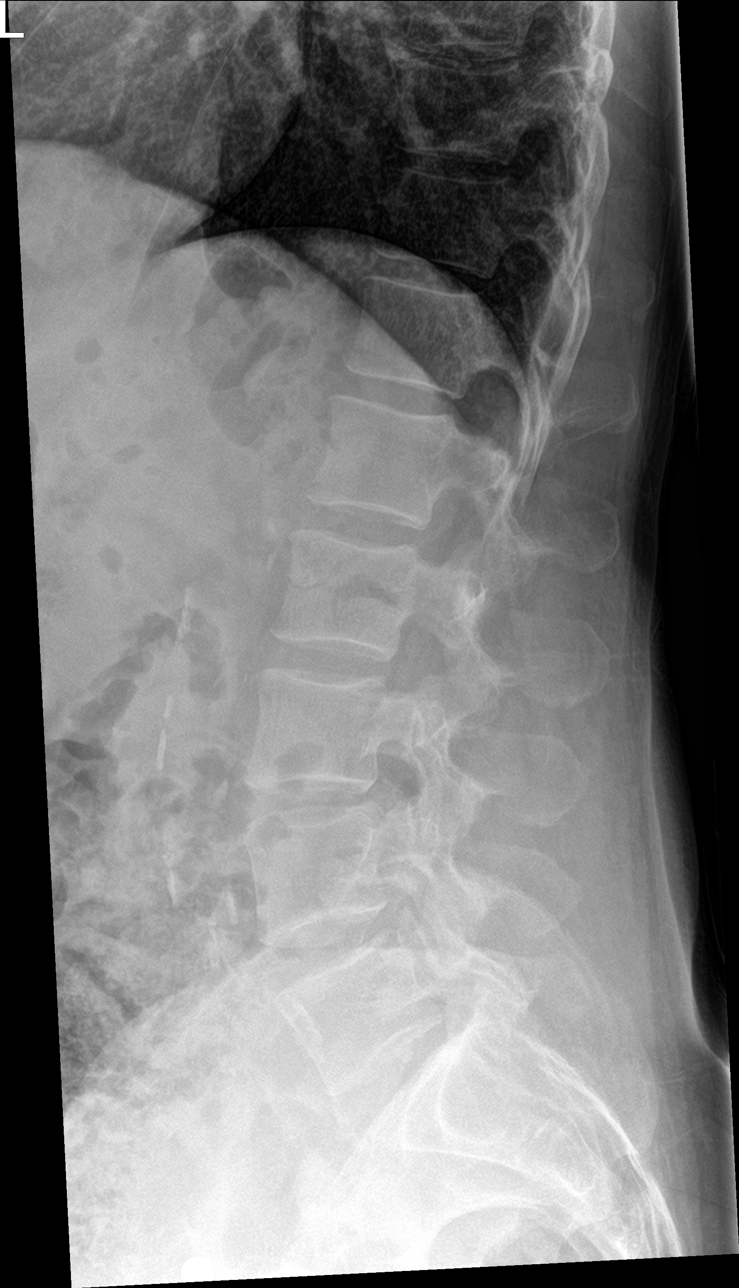

[l-spine spot]
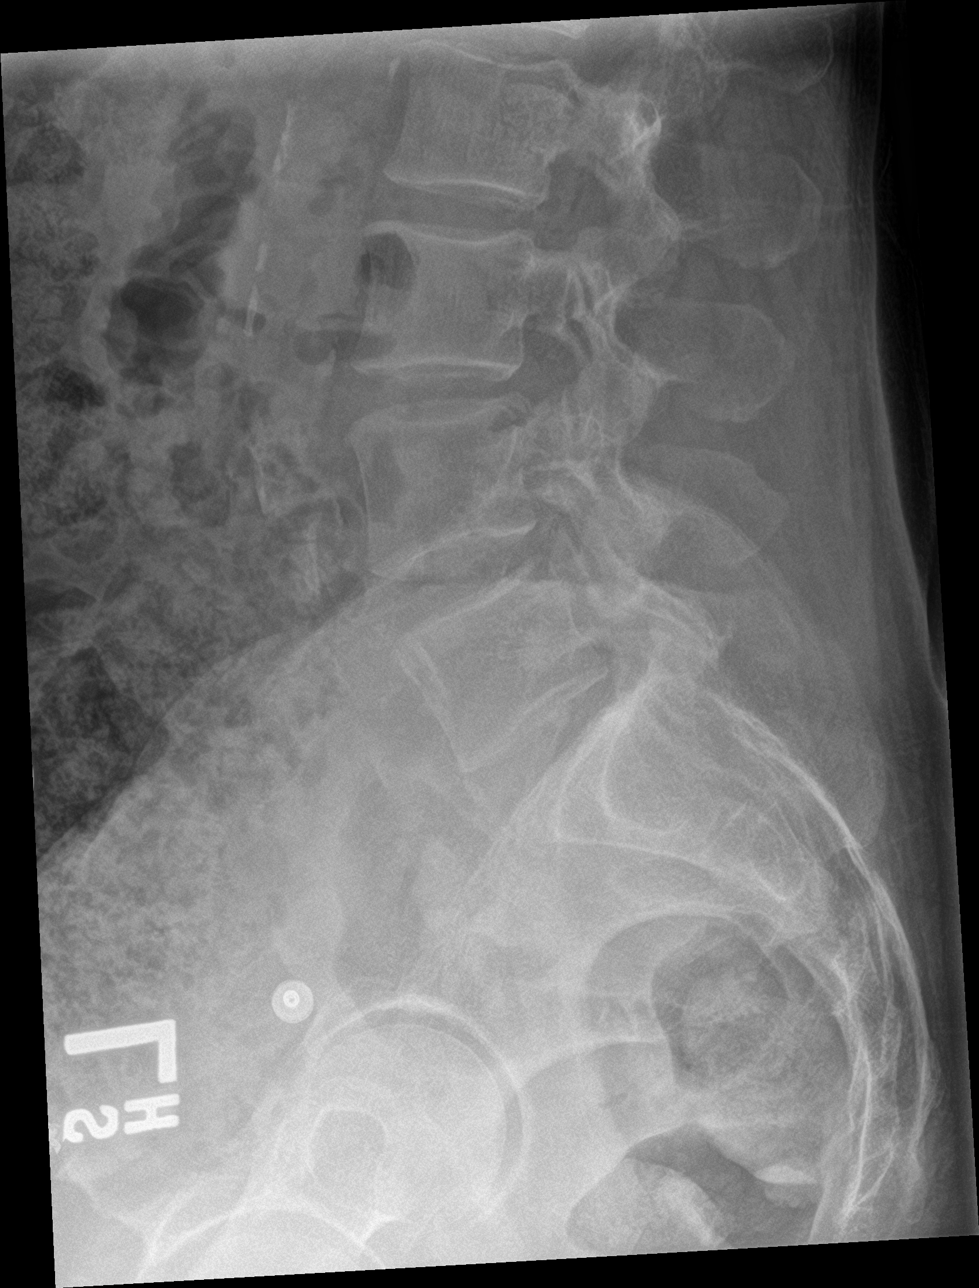

[5 of 5 positions shown; findings below may reference images not displayed]

FINDINGS: There is gentle curvature convex toward the right centered at the
thoracolumbar junction. The lumbar vertebral bodies are preserved in
height. The disc space heights are well maintained. There is no
spondylolisthesis. There is no significant facet joint hypertrophy.
The pedicles and transverse processes are grossly normal.
IMPRESSION: Gentle dextrocurvature centered at the thoracolumbar junction may
reflect muscle spasm. No acute or chronic bony abnormality. No
significant disc space height loss.

## 2020-02-24 ENCOUNTER — Emergency Department (INDEPENDENT_AMBULATORY_CARE_PROVIDER_SITE_OTHER)
Admission: EM | Admit: 2020-02-24 | Discharge: 2020-02-24 | Disposition: A | Discharge status: Home / Self Care | Payer: No Typology Code available for payment source | Source: Home / Self Care

## 2020-02-24 ENCOUNTER — Encounter: Payer: Self-pay | Admitting: Emergency Medicine

## 2020-02-24 ENCOUNTER — Other Ambulatory Visit: Payer: Self-pay

## 2020-02-24 DIAGNOSIS — J029 Acute pharyngitis, unspecified: Secondary | ICD-10-CM | POA: Diagnosis not present

## 2020-02-24 MED ORDER — LIDOCAINE VISCOUS HCL 2 % MT SOLN: 15.0000 mL | OROMUCOSAL | 0 refills | Status: DC | PRN

## 2020-02-24 MED ORDER — DEXAMETHASONE SODIUM PHOSPHATE 10 MG/ML IJ SOLN
10.0000 mg | Freq: Once | INTRAMUSCULAR | Status: AC
  Administered 2020-02-24: 10 mg via INTRAMUSCULAR

## 2020-02-24 MED ORDER — PREDNISONE 20 MG PO TABS: 40.0000 mg | ORAL_TABLET | Freq: Every day | ORAL | 0 refills | Status: AC

## 2020-02-24 NOTE — ED Triage Notes (Addendum)
Sore throat x 3 days Vaccinated 

## 2020-02-24 NOTE — ED Provider Notes (Signed)
[facility]    CSN: [id number] Arrival date & time: 02/24/20  1029      History   Chief Complaint Chief Complaint  Patient presents with  . Sore Throat    HPI Teresa Kelley is a 59 y.o. female.   HPI  Patient presents today for evaluation of sore throat x3 days.  Patient reports pain in her lower esophagus.  She reports noting redness on the back of her throat and endorses some mild hoarseness.  She she is afebrile.  Denies any other URI symptoms.  She has not taken anything for pain or gargle with any warm salt water.  She reports a history of recurrent strep.  She is immunocompromise due to connective tissue disease and is followed by rheumatology.  Past Medical History:  Diagnosis Date  . 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  ANA positive.  . Vaginal Pap smear, abnormal     Patient Active Problem List   Diagnosis Date Noted  . 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 Thoracic aorta atherosclerosis (HCC) 12/25/2017  . Smoking 05/25/2017  . ANA positive 03/02/2017  . Elevated CK 03/02/2017  . Body aches 02/02/2017  . Vitamin D deficiency 04/26/2016  . Rash and nonspecific skin eruption 10/05/2015  . Narcolepsy 12/07/2011    Past Surgical History:  Procedure Laterality Date  . COLPOSCOPY      OB History    Gravida  4   Para  3   Term  2   Preterm  1   AB      Living        SAB      TAB      Ectopic      Multiple      Live Births  3            Home Medications    Prior to Admission medications   Medication Sig Start Date End Date Taking? Authorizing Provider  b complex vitamins capsule Take 1 capsule by mouth daily.    [provider]  diclofenac sodium (VOLTAREN) 1 % GEL Apply 4 g topically 4 (four) times daily. To affected joint. 01/23/18   Rodolph Bong, MD  meloxicam (MOBIC) 15 MG tablet Take 1 tablet (15 mg total) by mouth daily. As  needed for aches/pains 12/25/17   Sunnie Nielsen, DO    Family History Family History  Problem Relation Age of Onset  . Cancer Father   . Colon cancer Father     Social History Social History   Tobacco Use  . Smoking status: Former Smoker    Packs/day: 1.00    Years: 30.00    Pack years: 30.00    Types: Cigarettes    Quit date: 08/17/2017    Years since quitting: 2.5  . Smokeless tobacco: Never Used  Vaping Use  . Vaping Use: Never used  Substance Use Topics  . Alcohol use: No  . Drug use: No     Allergies   Amoxicillin   Review of Systems Review of Systems Pertinent negatives listed in HPI   Physical Exam Triage Vital Signs ED Triage Vitals  Enc Vitals Group     BP 02/24/20 1042 (!) 151/81     Pulse Rate 02/24/20 1042 80     Resp --      Temp 02/24/20 1042 98.6 F (37 C)     Temp Source 02/24/20 1042 Oral     SpO2 02/24/20  1042 96 %     Weight 02/24/20 1043 145 lb (65.8 kg)     Height 02/24/20 1043 5\' 4"  (1.626 m)     Head Circumference --      Peak Flow --      Pain Score 02/24/20 1043 9     Pain Loc --      Pain Edu? --      Excl. in GC? --    No data found.  Updated Vital Signs BP (!) 151/81 (BP Location: Right Arm)   Pulse 80   Temp 98.6 F (37 C) (Oral)   Ht 5\' 4"  (1.626 m)   Wt 145 lb (65.8 kg)   SpO2 96%   BMI 24.89 kg/m   Visual Acuity Right Eye Distance:   Left Eye Distance:   Bilateral Distance:    Right Eye Near:   Left Eye Near:    Bilateral Near:     Physical Exam General Appearance:    Alert, Cooperative, normal appearance   HENT:   ENT exam normal, cervical adenopathy present, oropharynx mild erythema present left posterior pharynx No sinus tenderness or congestion, voice hoarse   Eyes:    PERRL, conjunctiva/corneas clear, EOM's intact       Lungs:     Clear to auscultation bilaterally, respirations unlabored  Heart:    Regular rate and rhythm  Neurologic:   Awake, alert, oriented x 3. No apparent focal  neurological           defect.    UC Treatments / Results  Labs (all labs ordered are listed, but only abnormal results are displayed) Labs Reviewed - No data to display  EKG   Radiology No results found.  Procedures Procedures (including critical care time)  Medications Ordered in UC Medications - No data to display  Initial Impression / Assessment and Plan / UC Course  I have reviewed the triage vital signs and the nursing notes.  Pertinent labs & imaging results that were available during my care of the patient were reviewed by me and considered in my medical decision making (see chart for details).     Throat is unremarkable for strep and rapid strep is negative.  However will await a culture before prescribing any antibiotics.  Patient given prednisone that she will start tomorrow.  While here in office she is given a Decadron injection to help with throat pain and inflammation given pronounced cervical adenopathy.  Lidocaine viscous prescribed to mix with warm water gargle and spit.  Patient advised to follow-up with primary care provider if her symptoms do not completely resolve with prescribed medication.  If throat culture is positive will prescribe Bactrim as patient has a penicillin allergy.   Final Clinical Impressions(s) / UC Diagnoses   Final diagnoses:  Acute pharyngitis, unspecified etiology     Discharge Instructions     Take Prednisone 40 mg,  in mornings with breakfast daily for total of 5 days.  Start medication tomorrow.  You received a steroid injection here in clinic today. Lidocaine viscous mix with warm salt water gargle as needed for pain.   Throat culture pending and if any changes to therapy are warranted we will contact you via MyChart.    ED Prescriptions    Medication Sig Dispense Auth. Provider   predniSONE (DELTASONE) 20 MG tablet Take 2 tablets (40 mg total) by mouth daily with breakfast. 10 tablet 13/08/21, FNP   lidocaine  (XYLOCAINE) 2 % solution Use as  directed 15 mLs in the mouth or throat as needed for mouth pain (Mix with warm water gargle and spit as needed for pain). 60 mL Bing Neighbors, FNP     PDMP not reviewed this encounter.   Bing Neighbors, FNP 02/24/20 1230

## 2020-02-24 NOTE — Discharge Instructions (Addendum)
Take Prednisone 40 mg,  in mornings with breakfast daily for total of 5 days.  Start medication tomorrow.  You received a steroid injection here in clinic today. Lidocaine viscous mix with warm salt water gargle as needed for pain.   Throat culture pending and if any changes to therapy are warranted we will contact you via MyChart.

## 2020-02-25 ENCOUNTER — Telehealth: Payer: Self-pay | Admitting: Emergency Medicine

## 2020-02-25 LAB — STREP A DNA PROBE: Group A Strep Probe: NOT DETECTED

## 2020-02-25 NOTE — Telephone Encounter (Signed)
Pt called to see if Teresa Kelley would call in an antibiotic for her today. Pt also stated prednisone & viscous lidocaine were not available at her pharmacy. [name] stated she had some prednisone at home that she could take. RN [facility] or if patient called to another pharmacy and called back to [facility], we could send prescriptions to a different pharmacy. Chart reviewed w/ provider here today (Dr Cathren Harsh). No prescription today, Dr Cathren Harsh wants to wait on strep culture.

## 2021-03-26 ENCOUNTER — Ambulatory Visit: Payer: BC Managed Care – PPO | Admitting: Medical-Surgical

## 2022-02-09 ENCOUNTER — Encounter: Payer: Self-pay | Admitting: Neurology

## 2022-03-30 NOTE — Progress Notes (Signed)
 Initial neurology clinic note  SERVICE DATE: 04/06/22  Reason for Evaluation: Consultation requested by Aryal, Govinda, MD for an opinion regarding muscle weakness and tingling. My final recommendations will be communicated back to the requesting physician by way of shared medical record or letter to requesting physician via US mail.  HPI: This is Ms. Teresa Kelley, a 61 y.o. right-handed female with a medical history of seronegative RA, Sjogren's syndrome, fibromyalgia, anxiety, and ?narcolepsy who presents to neurology clinic with the chief complaint of muscle weakness, numbness, tingling, and pain. The patient is alone today.  Patient's symptoms started 5-6 years ago. She noticed her arms and legs were getting weak. She felt like her muscles were going away. She was very active and kept herself strong. At this point, she went to sports medicine and found high ANA. She saw rheumatology who started hydrochloroquine and prednisone. Over the years, she finds her symptoms have worsened.   Currently 1-2 times per month she will have episodes of extreme pain. She will have burning in her thighs. She has severe cramps in her calves, especially at night.  She also mentions losing vision in her left eye. She has seen her eye doctor who she said looked okay, but she needed new glasses. In 1 month, she needed a new prescription. There may have been signs of cataracts, so she is being referred for this. Patient gets high dose prednisone bursts that improves her eye. When she comes off the prednisone the eye becomes blurry again. Her right eye is fine.  She has numbness and tingling in toes and finger tips for at least 2-3 years. It comes and goes. It occurs maybe once per weak. She denies color changes in her hands.  Patient is currently on hydrochoroquine 200 mg and Abatacept.  She was recently on prednisone burst, but finished this last week. She was on prednisone for 2-3 years  previously.  The patient denies symptoms suggestive of oculobulbar weakness including diplopia, ptosis, dysphagia, poor saliva control, dysarthria/dysphonia, impaired mastication, facial weakness/droop. About 1 year ago she was having difficulty opening the left eye when she would wake up. It would be fine throughout the day. The eyelid would twitch during the day. She has not had this in the past year. She thinks she may occasionally choke on saliva but no clear choking while eating or drinking.  There are no neuromuscular respiratory weakness symptoms, particularly orthopnea>dyspnea.   Patient does get palpitations when laying down. She is supposed to be seeing cardiology for this.  The patient has not noticed any recent skin rashes nor does she report any constitutional symptoms like fever, night sweats, anorexia or unintentional weight loss. She did mention a rash on her neck at symptom onset. It disappeared after hydrochloroquine. She did lose some weight when cut out carbs from her diet. Of late, she has gained some back.  EtOH use: No  Restrictive diet? Not currently Family history of neuropathy/myopathy/NM disease? No  She has never had an EMG.   MEDICATIONS:  Outpatient Encounter Medications as of 04/06/2022  Medication Sig   abatacept in sodium chloride 0.9 % Inject into the vein every 28 (twenty-eight) days. Infusion once a month   cholecalciferol (VITAMIN D3) 25 MCG (1000 UNIT) tablet Take 1,000 Units by mouth daily.   clindamycin (CLEOCIN) 300 MG capsule Take 300 mg by mouth every 6 (six) hours.   diclofenac sodium (VOLTAREN) 1 % GEL Apply 4 g topically 4 (four) times daily. To affected joint.     hydroxychloroquine (PLAQUENIL) 200 MG tablet Take 200 mg by mouth daily.   predniSONE (DELTASONE) 20 MG tablet Take 2 tablets (40 mg total) by mouth daily with breakfast. (Patient not taking: Reported on 04/06/2022)   [DISCONTINUED] b complex vitamins capsule Take 1 capsule by mouth  daily.   [DISCONTINUED] lidocaine (XYLOCAINE) 2 % solution Use as directed 15 mLs in the mouth or throat as needed for mouth pain (Mix with warm water gargle and spit as needed for pain).   [DISCONTINUED] meloxicam (MOBIC) 15 MG tablet Take 1 tablet (15 mg total) by mouth daily. As needed for aches/pains   No facility-administered encounter medications on file as of 04/06/2022.    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  ANA positive.   Vaginal Pap smear, abnormal     PAST SURGICAL HISTORY: Past Surgical History:  Procedure Laterality Date   COLPOSCOPY      ALLERGIES: Allergies  Allergen Reactions   Amoxicillin Nausea And Vomiting    FAMILY HISTORY: Family History  Problem Relation Age of Onset   Cancer Father    Colon cancer Father     SOCIAL HISTORY: Social History   Tobacco Use   Smoking status: Former    Packs/day: 1.00    Years: 30.00    Total pack years: 30.00    Types: Cigarettes    Quit date: 08/17/2017    Years since quitting: 4.6   Smokeless tobacco: Never  Vaping Use   Vaping Use: Never used  Substance Use Topics   Alcohol use: No   Drug use: No   Social History   Social History Narrative   Are you right handed or left handed? Right handed    Are you currently employed ?  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 yes   Who lives with you? no   What type of home do you live in: 1 story or 2 story? 2nd floor         OBJECTIVE: PHYSICAL EXAM: BP (!) 153/85   Pulse 67   Ht 5' 4" (1.626 m)   Wt 137 lb (62.1 kg)   SpO2 98%   BMI 23.52 kg/m   General: General appearance: Awake and alert. No distress. Cooperative with exam.  Skin: No obvious rash or jaundice. HEENT: Atraumatic. Anicteric. Lungs: Non-labored breathing on room air  Extremities: No edema. No obvious deformity.   Psych: Flat affect  Neurological: Mental Status: Alert. Speech fluent. No pseudobulbar affect Cranial Nerves: CNII: No RAPD. Visual fields grossly intact. CNIII, IV, VI: PERRL. No nystagmus. EOMI. CN V: Facial sensation intact bilaterally to fine touch. CN VII: Facial muscles symmetric and strong. No ptosis at rest. CN VIII: Hearing grossly intact bilaterally. CN IX: No hypophonia. CN X: Palate elevates symmetrically. CN XI: Full strength shoulder shrug bilaterally. CN XII: Tongue protrusion full and midline. No atrophy or fasciculations. No significant dysarthria Motor: Tone is normal. No fasciculations in extremities. No atrophy.  Individual muscle group testing (MRC grade out of 5):  Movement     Neck flexion 5    Neck extension 5     Right Left   Shoulder abduction 5 5   Shoulder adduction 5 5   Shoulder ext rotation 5 5   Shoulder int rotation 5 5   Elbow flexion 5 5   Elbow extension   5 5   Wrist extension 5 5   Wrist flexion 5 5   Finger abduction - FDI 5 5   Finger abduction - ADM 5 5   Finger extension 5 5   Finger distal flexion - 2/3 5 5   Finger distal flexion - 4/5 5 5   Thumb flexion - FPL 5 5   Thumb abduction - APB 5 5    Hip flexion 5 5   Hip extension 5 5   Hip adduction 5 5   Hip abduction 5 5   Knee extension 5 5   Knee flexion 5 5   Dorsiflexion 5 5   Plantarflexion 5 5     Reflexes:  Right Left   Bicep 2+ 2+   Tricep 2+ 2+   BrRad 2+ 2+   Knee 2+ 2+   Ankle 2+ 2+    Pathological Reflexes: Babinski: flexor response bilaterally Hoffman: absent bilaterally Troemner: absent bilaterally Sensation: Pinprick: Intact in all extremities Vibration: Intact in upper extremities. 7 seconds in left great toe, 14 seconds in right great toe Proprioception: Intact in bilateral great toes. Coordination: Intact finger-to- nose-finger bilaterally. Romberg with mild sway. Gait: Able to rise from chair with arms crossed unassisted. Normal,  narrow-based gait. Able to tandem walk. Able to walk on toes and heels.  Lab and Test Review: Internal labs: CK (02/02/2017): elevated to 450  External labs: (01/2022) Normal or unremarkable: CMP, CBC, CRP, ESR, aldolase (3.9), CK 108 (12/22/21), CRP, Hep B, Hep C, TB quant gold, TSH (0.90), Vit D, myositis panel  Positive SSA (Ro60), ANA    Lumbar spine xray (12/13/17): FINDINGS: There is gentle curvature convex toward the right centered at the thoracolumbar junction. The lumbar vertebral bodies are preserved in height. The disc space heights are well maintained. There is no spondylolisthesis. There is no significant facet joint hypertrophy. The pedicles and transverse processes are grossly normal.   IMPRESSION: Gentle dextrocurvature centered at the thoracolumbar junction may reflect muscle spasm. No acute or chronic bony abnormality. No significant disc space height loss.  Thoracic spine xray (12/13/17): IMPRESSION: There is no acute or significant chronic bony abnormality of the thoracic spine. Mild dextrocurvature centered at the thoracolumbar junction may be chronic or could be acute and secondary to muscle spasm.   Thoracic aortic atherosclerosis.   Coarse interstitial lung markings bilaterally may reflect chronic fibrosis or less likely acute interstitial pneumonia.   ASSESSMENT: Teresa Kelley is a 61 y.o. female who presents for evaluation of muscle weakness, numbness, tingling, and pain. She has a relevant medical history of seronegative RA, Sjogren's syndrome, fibromyalgia, anxiety, and ?narcolepsy. Her neurological examination is pertinent for mildly diminished sensation in bilateral feet to vibration, but intact strength and reflexes. Available diagnostic data is significant for elevated CK in 2018, positive ANA and SSA. The etiology of patient's symptoms is currently unclear as this is a complex case. Her symptoms of proximal pain and weakness could be  concerning for myopathy, but there is no objective evidence of weakness today and numbness and tingling would not be expected with myopathy. Neuropathy is also possible, particularly a sensory predominant neuropathy as can be associated with Sjogren's syndrome (sensory neuronopathy). I will get blood work to look for treatable causes and an EMG to further sort things out. How or if her left vision changes are related to other symptoms is currently unclear but curious given that it seems to improve with steroids.  PLAN: -Blood work: ESR, CRP, CK, aldolase,   B12, IFE, A1c -EMG (L > R) PN protocol -Over the counter cramp recommendations given (see patient instructions) -Start gabapentin 300 mg at bedtime (morning for patient as she works nights)  -Return to clinic 2 months  The impression above as well as the plan as outlined below were extensively discussed with the patient who voiced understanding. All questions were answered to their satisfaction.  When available, results of the above investigations and possible further recommendations will be communicated to the patient via telephone/MyChart. Patient to call office if not contacted after expected testing turnaround time.   Total time spent reviewing records, interview, history/exam, documentation, and coordination of care on day of encounter:  60 min   Thank you for allowing me to participate in patient's care.  If I can answer any additional questions, I would be pleased to do so.  Jeremy Hill, MD   CC: Alexander, Natalie, DO [address] [place] [address]  CC: Referring provider: Aryal, Govinda, MD [address] [place],  [place] [address]  

## 2022-04-06 ENCOUNTER — Encounter: Payer: Self-pay | Admitting: Neurology

## 2022-04-06 ENCOUNTER — Other Ambulatory Visit (INDEPENDENT_AMBULATORY_CARE_PROVIDER_SITE_OTHER): Payer: BC Managed Care – PPO

## 2022-04-06 ENCOUNTER — Ambulatory Visit: Payer: BC Managed Care – PPO | Admitting: Neurology

## 2022-04-06 VITALS — BP 153/85 | HR 67 | Ht 64.0 in | Wt 137.0 lb

## 2022-04-06 DIAGNOSIS — R209 Unspecified disturbances of skin sensation: Secondary | ICD-10-CM | POA: Diagnosis not present

## 2022-04-06 DIAGNOSIS — R531 Weakness: Secondary | ICD-10-CM | POA: Diagnosis not present

## 2022-04-06 DIAGNOSIS — R252 Cramp and spasm: Secondary | ICD-10-CM

## 2022-04-06 DIAGNOSIS — Z131 Encounter for screening for diabetes mellitus: Secondary | ICD-10-CM

## 2022-04-06 LAB — SEDIMENTATION RATE: Sed Rate: 30 mm/hr (ref 0–30)

## 2022-04-06 LAB — C-REACTIVE PROTEIN: CRP: <1 mg/dL (ref 0.5–20.0)

## 2022-04-06 LAB — CK: Total CK: 63 U/L (ref 7–177)

## 2022-04-06 LAB — VITAMIN B12: Vitamin B-12: 361 pg/mL (ref 211–911)

## 2022-04-06 LAB — HEMOGLOBIN A1C: Hgb A1c MFr Bld: 5.6 % (ref 4.6–6.5)

## 2022-04-06 MED ORDER — GABAPENTIN 300 MG PO CAPS: 300.0000 mg | ORAL_CAPSULE | Freq: Every day | ORAL | 11 refills | Status: AC

## 2022-04-06 NOTE — Patient Instructions (Addendum)
I would like to investigate your symptoms further: -Blood work today -Muscle and nerve test called an EMG (see more information below)  -Recommend the following measures that may provide some symptomatic benefit for muscle twitching and/or cramps: - Adequate oral clear fluid intake to maintain optimal hydration (about 2.5 liters, or around 8-10 glasses per day) Avoidance of caffeine Trial of DIET tonic water: About 1 glass, up to 6 times daily Magnesium oxide up to 400 mg by mouth twice daily, as needed (over the counter) Gentle muscle stretching routine, especially before bedtime  I will be in touch when I have your results.  I would like to see you back in clinic in 2 months. Please let me know if you have any questions or concerns in the meantime.   The physicians and staff at [facility] are committed to providing excellent care. You may receive a survey requesting feedback about your experience at our office. We strive to receive "very good" responses to the survey questions. If you feel that your experience would prevent you from giving the office a "very good " response, please contact our office to try to remedy the situation. We may be reached at [phone]. Thank you for taking the time out of your busy day to complete the survey.  Jacquelyne Balint, MD [facility]  ELECTROMYOGRAM AND NERVE CONDUCTION STUDIES (EMG/NCS) INSTRUCTIONS  How to Prepare The neurologist conducting the EMG will need to know if you have certain medical conditions. Tell the neurologist and other EMG lab personnel if you: Have a pacemaker or any other electrical medical device Take blood-thinning medications Have hemophilia, a blood-clotting disorder that causes prolonged bleeding Bathing Take a shower or bath shortly before your exam in order to remove oils from your skin. Don't apply lotions or creams before the exam.  What to Expect You'll likely be asked to change into a hospital gown for the  procedure and lie down on an examination table. The following explanations can help you understand what will happen during the exam.  Electrodes. The neurologist or a technician places surface electrodes at various locations on your skin depending on where you're experiencing symptoms. Or the neurologist may insert needle electrodes at different sites depending on your symptoms.  Sensations. The electrodes will at times transmit a tiny electrical current that you may feel as a twinge or spasm. The needle electrode may cause discomfort or pain that usually ends shortly after the needle is removed. If you are concerned about discomfort or pain, you may want to talk to the neurologist about taking a short break during the exam.  Instructions. During the needle EMG, the neurologist will assess whether there is any spontaneous electrical activity when the muscle is at rest - activity that isn't present in healthy muscle tissue - and the degree of activity when you slightly contract the muscle.  He or she will give you instructions on resting and contracting a muscle at appropriate times. Depending on what muscles and nerves the neurologist is examining, he or she may ask you to change positions during the exam.  After your EMG You may experience some temporary, minor bruising where the needle electrode was inserted into your muscle. This bruising should fade within several days. If it persists, contact your primary care doctor.

## 2022-04-07 ENCOUNTER — Ambulatory Visit
Admission: EM | Admit: 2022-04-07 | Discharge: 2022-04-07 | Disposition: A | Discharge status: Home / Self Care | Payer: BC Managed Care – PPO | Attending: Family Medicine | Admitting: Family Medicine

## 2022-04-07 DIAGNOSIS — R030 Elevated blood-pressure reading, without diagnosis of hypertension: Secondary | ICD-10-CM | POA: Diagnosis not present

## 2022-04-07 MED ORDER — CEPHALEXIN 500 MG PO CAPS: ORAL_CAPSULE | ORAL | 0 refills | Status: AC

## 2022-04-07 MED ORDER — ACETAMINOPHEN 325 MG PO TABS
650.0000 mg | ORAL_TABLET | Freq: Once | ORAL | Status: AC
  Administered 2022-04-07: 650 mg via ORAL

## 2022-04-07 NOTE — ED Provider Notes (Signed)
Teresa Kelley CARE    CSN: [id number] Arrival date & time: 04/07/22  [id number]      History   Chief Complaint Chief Complaint  Patient presents with   Tachycardia   Headache   Dizziness    HPI Teresa Kelley is a 61 y.o. female.   Patient complains of onset of mild dizziness, headache, and fatigue yesterday.  Today she awoke with a persistent mild headache and a sensation of her heart beating "harder" and "racing."  She denies chest pain, shortness of breath, fever, URI symptoms, myalgias, fatigue, etc.  She reports that she was started on clindamycin 300mg  QID two days ago by her dentist for suspected dental abscess, and she wonders if that could be causing her symptoms.  She has a follow-up appointment with her dentist on 04/19/22. She has a relevant medical history of seronegative RA, Sjogren's syndrome, fibromyalgia, anxiety, and ?narcolepsy.  She is presently on hydrochloroquine and Abatacept. Patient was evaluated in neurology clinic yesterday for a 5 to 6 year history of muscle weakness, numbness, tingling, and pain. Review of records from her neurology visit reveals that her examination was pertinent for mildly diminished sensation in bilateral feet to vibration, but intact strength and reflexes. She was scheduled for a comprehensive panel of blood tests as well as EMG.  The history is provided by the patient.    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  ANA positive.   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 Authorizing Provider  cephALEXin (KEFLEX) 500 MG capsule Take one cap PO Q6hr 04/07/22  Yes Kandra Nicolas, MD  abatacept in sodium chloride 0.9 % Inject into the vein every 28 (twenty-eight) days. Infusion once a month    [provider]  cholecalciferol (VITAMIN D3) 25 MCG (1000 UNIT) tablet Take 1,000 Units by mouth daily.    [provider]  diclofenac sodium (VOLTAREN) 1 % GEL Apply 4 g topically 4 (four) times daily. To affected joint. 01/23/18   Gregor Hams, MD  gabapentin (NEURONTIN) 300 MG capsule Take 1 capsule (300 mg total) by mouth at bedtime. Take at your bedtime (morning) 04/06/22   Shellia Carwin, MD  hydroxychloroquine (PLAQUENIL) 200 MG tablet Take 200 mg by mouth daily. 03/07/17   [provider]  predniSONE (DELTASONE) 20 MG tablet Take 2 tablets (40 mg total) by mouth daily with breakfast. Patient  not taking: Reported on 04/06/2022 02/25/20   Scot Jun, FNP    Family History Family History  Problem Relation Age of Onset   Cancer Father    Colon cancer Father     Social History Social History   Tobacco Use   Smoking status: Former    Packs/day: 1.00    Years: 30.00    Total pack years: 30.00    Types: Cigarettes    Quit date: 08/17/2017    Years since quitting: 4.6   Smokeless tobacco: Never  Vaping Use   Vaping Use: Never used  Substance Use Topics   Alcohol use: No   Drug use: No     Allergies   Amoxicillin   Review of Systems Review of Systems  Constitutional: Negative.   HENT: Negative.    Eyes: Negative.   Respiratory: Negative.    Cardiovascular:  Positive for  palpitations. Negative for chest pain and leg swelling.  Gastrointestinal: Negative.   Endocrine: Negative.   Genitourinary: Negative.   Musculoskeletal: Negative.   Skin: Negative.   Neurological:  Positive for dizziness and headaches. Negative for tremors, seizures, syncope, facial asymmetry, speech difficulty, weakness, light-headedness and numbness.  Hematological:  Negative for adenopathy.     Physical Exam Triage Vital Signs ED Triage Vitals  Enc Vitals Group     BP 04/07/22 1007 (!) 170/96     Pulse Rate 04/07/22 1007 66     Resp 04/07/22 1007 12     Temp 04/07/22 1007 98.5 F (36.9 C)     Temp Source 04/07/22 1007 Oral     SpO2 04/07/22 1007 99 %     Weight --      Height --      Head Circumference --      Peak Flow --      Pain Score 04/07/22 1009 4     Pain Loc --      Pain Edu? --      Excl. in [place]? --    No data found.  Updated Vital Signs BP (!) 157/102 (BP Location: Left Arm)   Pulse 66   Temp 98.5 F (36.9 C) (Oral)   Resp 12   SpO2 99%   Visual Acuity Right Eye Distance:   Left Eye Distance:   Bilateral Distance:    Right Eye Near:   Left Eye Near:    Bilateral Near:     Physical Exam Vitals and nursing note reviewed.  Constitutional:      General: She is not in acute distress.    Appearance: Normal appearance.  HENT:     Head: Normocephalic.     Right Ear: Tympanic membrane and ear canal normal.     Left Ear: Tympanic membrane and ear canal normal.     Nose: Nose normal.     Mouth/Throat:     Mouth: Mucous membranes are moist.     Pharynx: Oropharynx is clear.  Eyes:     Extraocular Movements: Extraocular movements intact.     Conjunctiva/sclera: Conjunctivae normal.     Pupils: Pupils are equal, round, and reactive to light.  Cardiovascular:     Rate and Rhythm: Normal rate and regular rhythm.     Heart sounds: Normal heart sounds.  Pulmonary:     Breath sounds: Normal breath sounds.  Abdominal:     Palpations: Abdomen is soft.      Tenderness: There is no abdominal tenderness.  Musculoskeletal:     Cervical back: Neck  supple.     Right lower leg: No edema.     Left lower leg: No edema.  Lymphadenopathy:     Cervical: No cervical adenopathy.  Skin:    General: Skin is warm and dry.  Neurological:     General: No focal deficit present.     Mental Status: She is alert and oriented to person, place, and time.      UC Treatments / Results  Labs (all labs ordered are listed, but only abnormal results are displayed) Labs Reviewed - No data to display  EKG  Rate:  68 BPM PR:  150 msec QT:  408 msec QTcH:  433 msec QRSD:  82 msec QRS axis:  42 degrees Interpretation:  within normal limits; normal sinus rhythm    Radiology No results found.  Procedures Procedures (including critical care time)  Medications Ordered in UC Medications  acetaminophen (TYLENOL) tablet 650 mg (650 mg Oral Given 04/07/22 1301)    Initial Impression / Assessment and Plan / UC Course  I have reviewed the triage vital signs and the nursing notes.  Pertinent labs & imaging results that were available during my care of the patient were reviewed by me and considered in my medical decision making (see chart for details).    Normal EKG reassuring.  Unremarkable physical exam except for elevated BP.  Note that patient is presently undergoing a comprehensive neurologic evaluation.   Her symptoms could represent a viral prodrome. ?adverse effect from clindamycin.  As a precaution, will switch to Keflex. Followup with dentist and neurologist as scheduled.  Final Clinical Impressions(s) / UC Diagnoses   Final diagnoses:  Blood pressure elevated without history of HTN     Discharge Instructions      Please monitor your blood pressure several times weekly, at different times of the day, record on a calendar with times of measurement and take this record to your Family Doctor.  If symptoms become significantly worse during  the night or over the weekend, proceed to the local emergency room.     ED Prescriptions     Medication Sig Dispense Auth. Provider   cephALEXin (KEFLEX) 500 MG capsule Take one cap PO Q6hr 28 capsule Lattie Haw, MD         Lattie Haw, MD 04/09/22 [phone]

## 2022-04-07 NOTE — ED Notes (Signed)
Pt c/o HA during EKG. Tylenol given as standing order after EKG completed.

## 2022-04-07 NOTE — Discharge Instructions (Signed)
Please monitor your blood pressure several times weekly, at different times of the day, record on a calendar with times of measurement and take this record to your Family Doctor.  If symptoms become significantly worse during the night or over the weekend, proceed to the local emergency room.  

## 2022-04-07 NOTE — ED Triage Notes (Signed)
Pt presents with c/o HA, dizziness, and HA that began yesterday.

## 2022-04-08 LAB — IMMUNOFIXATION ELECTROPHORESIS: IgM, Serum: 134 mg/dL (ref 50–300)

## 2022-04-13 LAB — IMMUNOFIXATION ELECTROPHORESIS
IgM, Serum: 134 mg/dL (ref 50–300)
Immunoglobulin A: 119 mg/dL (ref 47–310)

## 2022-04-13 LAB — ALDOLASE: Aldolase: 4.8 U/L (ref <=8.1)

## 2022-06-06 ENCOUNTER — Ambulatory Visit: Payer: BC Managed Care – PPO | Admitting: Neurology

## 2022-06-06 ENCOUNTER — Telehealth: Payer: Self-pay | Admitting: Neurology

## 2022-06-06 DIAGNOSIS — R531 Weakness: Secondary | ICD-10-CM | POA: Diagnosis not present

## 2022-06-06 DIAGNOSIS — Z131 Encounter for screening for diabetes mellitus: Secondary | ICD-10-CM

## 2022-06-06 DIAGNOSIS — R209 Unspecified disturbances of skin sensation: Secondary | ICD-10-CM

## 2022-06-06 DIAGNOSIS — R252 Cramp and spasm: Secondary | ICD-10-CM

## 2022-06-06 NOTE — Telephone Encounter (Signed)
Discussed the results of patient's EMG after the procedure today. EMG was normal without evidence of myopathy, large fiber neuropathy, or left lumbosacral radiculopathy.    CK in 2018 was elevated to 450, but more recently was normal at 63 (04/06/22). The circumstances behind elevated CK in 2018 are unclear, but CK may be normally elevated after strenuous activity among other possibilities. There is no clear indication of muscle or peripheral nerve disease at this time to explain symptoms.  All questions were answered.  Kai Levins, MD [facility]

## 2022-06-06 NOTE — Procedures (Signed)
[facility]  [place], [address]  [place], [place] [address] Tel: [phone] Fax: [phone] Test Date:  06/06/2022  Patient: Teresa Kelley DOB: [birth year] Physician: Kai Levins, MD  Sex: Female Height: 5' 4"$  Ref Phys: Kai Levins, MD  ID#: [id number]   Technician:    History: This is a 62 year old female with pain and weakness.  NCV & EMG Findings: Extensive electrodiagnostic evaluation of the left lower limb with additional nerve conduction studies of the left upper limb shows: Left sural, superficial peroneal/fibular, median, ulnar, and radial sensory responses are within normal limits. Left peroneal/fibular (EDB), tibial (AH), median (APB), and ulnar (ADM) motor responses are within normal limits. Left H Reflex latency is within normal limits. There is no evidence of active or chronic motor axon loss changes affecting any of the tested muscles on needle examination. Motor unit configuration and recruitment pattern is within normal limits.  Impression: This is a normal study. Patient stopped needle evaluation before testing left upper limb due to discomfort. The findings of this study show: No electrodiagnostic evidence of myopathy. No electrodiagnostic evidence of large fiber sensorimotor polyneuropathy. No electrodiagnostic evidence of a left lumbosacral (L2-S1) motor radiculopathy. No electrodiagnostic evidence of a left median mononeuropathy at or distal to the wrist (ie: carpal tunnel syndrome).    ___________________________ Kai Levins, MD    Nerve Conduction Studies Motor Nerve Results    Latency Amplitude F-Lat Segment Distance CV Comment  Site (ms) Norm (mV) Norm (ms)  (cm) (m/s) Norm   Left Fibular (EDB) Motor  Ankle 3.1  < 6.0 4.3  > 2.5        Bel fib head 9.3 - 3.9 -  Bel fib head-Ankle 29 47  > 40   Pop fossa 11.2 - 3.8 -  Pop fossa-Bel fib head 9 47 -   Left Median (APB) Motor  Wrist 2.5  < 4.0 9.3  > 5.0        Elbow 7.3 -  8.9 -  Elbow-Wrist 26 54  > 50   Left Tibial (AH) Motor  Ankle 2.8  < 6.0 15.0  > 4.0        Knee 10.6 - 11.1 -  Knee-Ankle 37 47  > 40   Left Ulnar (ADM) Motor  Wrist 1.98  < 3.1 11.0  > 7.0        Bel elbow 5.2 - 10.8 -  Bel elbow-Wrist 20 63  > 50   Ab elbow 7.0 - 10.6 -  Ab elbow-Bel elbow 10 56 -    Sensory Sites    Neg Peak Lat Amplitude (O-P) Segment Distance Velocity Comment  Site (ms) Norm (V) Norm  (cm) (ms)   Left Median Sensory  Wrist-Dig II 2.9  < 3.8 25  > 10 Wrist-Dig II 13    Left Radial Sensory  Forearm-Wrist 2.1  < 2.8 25  > 10 Forearm-Wrist 10    Left Superficial Fibular Sensory  14 cm-Ankle 2.6  < 4.6 8  > 3 14 cm-Ankle 14    Left Sural Sensory  Calf-Lat mall 3.7  < 4.6 11  > 3 Calf-Lat mall 14    Left Ulnar Sensory  Wrist-Dig V 2.8  < 3.2 18  > 5 Wrist-Dig V 11     H-Reflex Results    M-Lat H Lat H-M Lat  Site (ms) (ms) Norm (ms)  Left Tibial H-Reflex  Pop fossa 5.5 34.9  < 35.0 29.4   Electromyography  Side Muscle Ins.Act Fibs Fasc Recrt Amp Dur Poly Activation Comment  Left Iliacus Nml Nml Nml Nml Nml Nml Nml Nml N/A  Left Tib ant Nml Nml Nml Nml Nml Nml Nml Nml N/A  Left Gastroc MH Nml Nml Nml Nml Nml Nml Nml Nml N/A  Left Vastus lat Nml Nml Nml Nml Nml Nml Nml Nml N/A  Left Gluteus med Nml Nml Nml Nml Nml Nml Nml Nml N/A      Waveforms:  Motor           Sensory             H-Reflex

## 2022-06-07 NOTE — Progress Notes (Signed)
NEUROLOGY FOLLOW UP OFFICE NOTE  Leylany Hevey [id number]  Subjective:  Teresa Kelley is a 62 y.o. year old right-handed female with a medical history of seronegative RA, Sjogren's syndrome, fibromyalgia, anxiety, and ?narcolepsy who we last saw on 04/06/22.  To briefly review: Patient's symptoms started 5-6 years ago. She noticed her arms and legs were getting weak. She felt like her muscles were going away. She was very active and kept herself strong. At this point, she went to sports medicine and found high ANA. She saw rheumatology who started hydrochloroquine and prednisone. Over the years, she finds her symptoms have worsened.   Currently 1-2 times per month she will have episodes of extreme pain. She will have burning in her thighs. She has severe cramps in her calves, especially at night.   She also mentions losing vision in her left eye. She has seen her eye doctor who she said looked okay, but she needed new glasses. In 1 month, she needed a new prescription. There may have been signs of cataracts, so she is being referred for this. Patient gets high dose prednisone bursts that improves her eye. When she comes off the prednisone the eye becomes blurry again. Her right eye is fine.   She has numbness and tingling in toes and finger tips for at least 2-3 years. It comes and goes. It occurs maybe once per weak. She denies color changes in her hands.   Patient is currently on hydrochoroquine 200 mg and Abatacept.   She was recently on prednisone burst, but finished this last week. She was on prednisone for 2-3 years previously.   The patient denies symptoms suggestive of oculobulbar weakness including diplopia, ptosis, dysphagia, poor saliva control, dysarthria/dysphonia, impaired mastication, facial weakness/droop. About 1 year ago she was having difficulty opening the left eye when she would wake up. It would be fine throughout the day. The eyelid would twitch during the  day. She has not had this in the past year. She thinks she may occasionally choke on saliva but no clear choking while eating or drinking.   There are no neuromuscular respiratory weakness symptoms, particularly orthopnea>dyspnea.    Patient does get palpitations when laying down. She is supposed to be seeing cardiology for this.   The patient has not noticed any recent skin rashes nor does she report any constitutional symptoms like fever, night sweats, anorexia or unintentional weight loss. She did mention a rash on her neck at symptom onset. It disappeared after hydrochloroquine. She did lose some weight when cut out carbs from her diet. Of late, she has gained some back.   EtOH use: No  Restrictive diet? Not currently Family history of neuropathy/myopathy/NM disease? No  Most recent Assessment and Plan (04/06/22): Her neurological examination is pertinent for mildly diminished sensation in bilateral feet to vibration, but intact strength and reflexes. Available diagnostic data is significant for elevated CK in 2018, positive ANA and SSA. The etiology of patient's symptoms is currently unclear as this is a complex case. Her symptoms of proximal pain and weakness could be concerning for myopathy, but there is no objective evidence of weakness today and numbness and tingling would not be expected with myopathy. Neuropathy is also possible, particularly a sensory predominant neuropathy as can be associated with Sjogren's syndrome (sensory neuronopathy). I will get blood work to look for treatable causes and an EMG to further sort things out. How or if her left vision changes are related to other symptoms is currently unclear  but curious given that it seems to improve with steroids.   PLAN: -Blood work: ESR, CRP, CK, aldolase, B12, IFE, A1c -EMG (L > R) PN protocol -Over the counter cramp recommendations given (see patient instructions) -Start gabapentin 300 mg at bedtime (morning for patient as  she works nights)  Since their last visit: Labs, including inflammatory markers and CK, were within normal limits. EMG on 06/06/22 was normal with no evidence of peripheral nerve disorder or of myopathy.  She is currently on a 1.5 week taper of steroids due to increased pain prescribed by rheumatology. Her symptoms are better with the steroids. Patient saw her eye doctor recently her told her she had cataracts in her left eye. This may explain her blurry vision, but would not explain the improvement with steroids.  She has not had any more symptoms of ptosis, no difficulty with swallowing or chewing, and no problems breathing.  She continues to having burning and aching in her thighs, hamstrings, and arms. It increases if she is using it.  She is not currently taking gabapentin regularly. She takes it every other week. It makes her tired and maybe works some.  MEDICATIONS:  Outpatient Encounter Medications as of 06/15/2022  Medication Sig   abatacept in sodium chloride 0.9 % Inject into the vein every 28 (twenty-eight) days. Infusion once a month   cholecalciferol (VITAMIN D3) 25 MCG (1000 UNIT) tablet Take 1,000 Units by mouth daily.   gabapentin (NEURONTIN) 300 MG capsule Take 1 capsule (300 mg total) by mouth at bedtime. Take at your bedtime (morning) (Patient taking differently: Take 300 mg by mouth at bedtime. prn)   hydroxychloroquine (PLAQUENIL) 200 MG tablet Take 200 mg by mouth daily.   ibuprofen (ADVIL) 800 MG tablet Take 800 mg by mouth every 8 (eight) hours.   cephALEXin (KEFLEX) 500 MG capsule Take one cap PO Q6hr (Patient not taking: Reported on 06/15/2022)   diclofenac sodium (VOLTAREN) 1 % GEL Apply 4 g topically 4 (four) times daily. To affected joint. (Patient not taking: Reported on 06/15/2022)   predniSONE (DELTASONE) 20 MG tablet Take 2 tablets (40 mg total) by mouth daily with breakfast. (Patient not taking: Reported on 04/06/2022)   No facility-administered encounter  medications on file as of 06/15/2022.    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  ANA positive.   Vaginal Pap smear, abnormal     PAST SURGICAL HISTORY: Past Surgical History:  Procedure Laterality Date   COLPOSCOPY      ALLERGIES: Allergies  Allergen Reactions   Amoxicillin Nausea And Vomiting    FAMILY HISTORY: Family History  Problem Relation Age of Onset   Cancer Father    Colon cancer Father     SOCIAL HISTORY: Social History   Tobacco Use   Smoking status: Former    Packs/day: 1.00    Years: 30.00    Total pack years: 30.00    Types: Cigarettes    Quit date: 08/17/2017    Years since quitting: 4.8   Smokeless tobacco: Never  Vaping Use   Vaping Use: Never used  Substance Use Topics   Alcohol use: No   Drug use: No   Social History   Social History Narrative   Are you right handed or left handed? Right handed    Are you currently employed ?  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 yes   Who lives with you? no   What type of home do you live in: 1 story or 2 story? 2nd floor    Caffeine 1 coffee a day      Objective:  Vital Signs:  BP 117/65   Pulse 74   Ht '5\' 4"'$  (1.626 m)   Wt 138 lb 6.4 oz (62.8 kg)   SpO2 96%   BMI 23.76 kg/m   General: No acute distress.  Patient appears well-groomed.   Head:  Normocephalic/atraumatic Neck: supple Extremities:  Pain to palpation in all extremities Neurological Exam: alert and oriented.  Speech fluent and not dysarthric, language intact.  CN II-XII intact. Bulk and tone normal, muscle strength 5/5 throughout.  Sensation to light touch intact.  Deep tendon reflexes 2+ throughout.  Finger to nose testing intact.  Gait normal.  Labs and Imaging review: New results: 04/06/22: CK: 63 Aldolase: 4.8 HbA1c: 5.6 B12: 361 ESR: 30 CRP: <  1.0 IFE: Normal pattern, no M protein  EMG (06/06/22): NCV & EMG Findings: Extensive electrodiagnostic evaluation of the left lower limb with additional nerve conduction studies of the left upper limb shows: Left sural, superficial peroneal/fibular, median, ulnar, and radial sensory responses are within normal limits. Left peroneal/fibular (EDB), tibial (AH), median (APB), and ulnar (ADM) motor responses are within normal limits. Left H Reflex latency is within normal limits. There is no evidence of active or chronic motor axon loss changes affecting any of the tested muscles on needle examination. Motor unit configuration and recruitment pattern is within normal limits.   Impression: This is a normal study. Patient stopped needle evaluation before testing left upper limb due to discomfort. The findings of this study show: No electrodiagnostic evidence of myopathy. No electrodiagnostic evidence of large fiber sensorimotor polyneuropathy. No electrodiagnostic evidence of a left lumbosacral (L2-S1) motor radiculopathy. No electrodiagnostic evidence of a left median mononeuropathy at or distal to the wrist (ie: carpal tunnel syndrome).   Previously reviewed results: CK (02/02/2017): elevated to 450   External labs: (01/2022) Normal or unremarkable: CMP, CBC, CRP, ESR, aldolase (3.9), CK 108 (12/22/21), CRP, Hep B, Hep C, TB quant gold, TSH (0.90), Vit D, myositis panel   Positive SSA (Ro60), ANA     Lumbar spine xray (12/13/17): FINDINGS: There is gentle curvature convex toward the right centered at the thoracolumbar junction. The lumbar vertebral bodies are preserved in height. The disc space heights are well maintained. There is no spondylolisthesis. There is no significant facet joint hypertrophy. The pedicles and transverse processes are grossly normal.   IMPRESSION: Gentle dextrocurvature centered at the thoracolumbar junction may reflect muscle spasm. No acute or chronic bony  abnormality. No significant disc space height loss.   Thoracic spine xray (12/13/17): IMPRESSION: There is no acute or significant chronic bony abnormality of the thoracic spine. Mild dextrocurvature centered at the thoracolumbar junction may be chronic or could be acute and secondary to muscle spasm.   Thoracic aortic atherosclerosis.   Coarse interstitial lung markings bilaterally may reflect chronic fibrosis or less likely acute interstitial pneumonia.  Assessment/Plan:  This is Teresa Kelley, a 62 y.o. female with subjective weakness and diffuse pain in all extremities. She also has intermittent blurry vision/vision loss in left eye improved by steroids. Her work up including labs and EMG did not show evidence of peripheral nerve or muscle pathology to explain symptoms. Her diffuse pain and pain to palpation of extremities could be 2/2 fibromyalgia. That steroids  helps with the pain is curious. I do not have a good explanation of eye symptoms either. She was found to have cataract on that eye, but again, not clear why steroids would help this.  Plan: -Getting cataract surgery on left eye. If not improving vision, will consider MRI (brain and/or orbits) -Start Cymbalta 30 mg qhs -Continue Gabapentin 300 mg qhs (will only take when not working per patient preference)  Return to clinic in 6 months  Total time spent reviewing records, interview, history/exam, documentation, and coordination of care on day of encounter:  30 min  Kai Levins, MD

## 2022-06-15 ENCOUNTER — Encounter: Payer: Self-pay | Admitting: Neurology

## 2022-06-15 ENCOUNTER — Ambulatory Visit: Payer: BC Managed Care – PPO | Admitting: Neurology

## 2022-06-15 VITALS — BP 117/65 | HR 74 | Ht 64.0 in | Wt 138.4 lb

## 2022-06-15 DIAGNOSIS — R209 Unspecified disturbances of skin sensation: Secondary | ICD-10-CM

## 2022-06-15 DIAGNOSIS — R52 Pain, unspecified: Secondary | ICD-10-CM

## 2022-06-15 DIAGNOSIS — H269 Unspecified cataract: Secondary | ICD-10-CM

## 2022-06-15 DIAGNOSIS — M797 Fibromyalgia: Secondary | ICD-10-CM

## 2022-06-15 DIAGNOSIS — R531 Weakness: Secondary | ICD-10-CM

## 2022-06-15 DIAGNOSIS — H538 Other visual disturbances: Secondary | ICD-10-CM

## 2022-06-15 DIAGNOSIS — R252 Cramp and spasm: Secondary | ICD-10-CM

## 2022-06-15 DIAGNOSIS — H5462 Unqualified visual loss, left eye, normal vision right eye: Secondary | ICD-10-CM

## 2022-06-15 MED ORDER — DULOXETINE HCL 30 MG PO CPEP: 30.0000 mg | ORAL_CAPSULE | Freq: Every day | ORAL | 5 refills | Status: DC

## 2022-06-15 NOTE — Patient Instructions (Signed)
We will start Cymbalta 30 mg at bedtime.  You can continue gabapentin 300 mg at bedtime when you feel comfortable taking or can stop if you do not want to take it.  After your cataract surgery we will re-evaluate your eye symptoms.  I want to see you back in 6 months or sooner if needed.  Please let me know if you have any questions or concerns in the meantime.   The physicians and staff at [facility] are committed to providing excellent care. You may receive a survey requesting feedback about your experience at our office. We strive to receive "very good" responses to the survey questions. If you feel that your experience would prevent you from giving the office a "very good " response, please contact our office to try to remedy the situation. We may be reached at [phone]. Thank you for taking the time out of your busy day to complete the survey.  Kai Levins, MD [facility]

## 2022-06-29 ENCOUNTER — Encounter: Payer: Self-pay | Admitting: Neurology

## 2022-07-13 ENCOUNTER — Other Ambulatory Visit: Payer: Self-pay | Admitting: Neurology

## 2022-07-13 DIAGNOSIS — R209 Unspecified disturbances of skin sensation: Secondary | ICD-10-CM

## 2022-07-13 DIAGNOSIS — R52 Pain, unspecified: Secondary | ICD-10-CM

## 2022-11-25 NOTE — Progress Notes (Deleted)
NEUROLOGY FOLLOW UP OFFICE NOTE  Vaughan Etcitty [id number]  Subjective:  Smt. Dobbertin is a 62 y.o. year old right-handed female with a medical history of seronegative RA, Sjogren's syndrome, fibromyalgia, anxiety, and ?narcolepsy who we last saw on 06/15/22.  To briefly review: Initial consultation 04/06/22: Patient's symptoms started 5-6 years ago. She noticed her arms and legs were getting weak. She felt like her muscles were going away. She was very active and kept herself strong. At this point, she went to sports medicine and found high ANA. She saw rheumatology who started hydrochloroquine and prednisone. Over the years, she finds her symptoms have worsened.   Currently 1-2 times per month she will have episodes of extreme pain. She will have burning in her thighs. She has severe cramps in her calves, especially at night.   She also mentions losing vision in her left eye. She has seen her eye doctor who she said looked okay, but she needed new glasses. In 1 month, she needed a new prescription. There may have been signs of cataracts, so she is being referred for this. Patient gets high dose prednisone bursts that improves her eye. When she comes off the prednisone the eye becomes blurry again. Her right eye is fine.   She has numbness and tingling in toes and finger tips for at least 2-3 years. It comes and goes. It occurs maybe once per weak. She denies color changes in her hands.   Patient is currently on hydrochoroquine 200 mg and Abatacept.   She was recently on prednisone burst, but finished this last week. She was on prednisone for 2-3 years previously.   The patient denies symptoms suggestive of oculobulbar weakness including diplopia, ptosis, dysphagia, poor saliva control, dysarthria/dysphonia, impaired mastication, facial weakness/droop. About 1 year ago she was having difficulty opening the left eye when she would wake up. It would be fine throughout the day. The  eyelid would twitch during the day. She has not had this in the past year. She thinks she may occasionally choke on saliva but no clear choking while eating or drinking.   There are no neuromuscular respiratory weakness symptoms, particularly orthopnea>dyspnea.    Patient does get palpitations when laying down. She is supposed to be seeing cardiology for this.   The patient has not noticed any recent skin rashes nor does she report any constitutional symptoms like fever, night sweats, anorexia or unintentional weight loss. She did mention a rash on her neck at symptom onset. It disappeared after hydrochloroquine. She did lose some weight when cut out carbs from her diet. Of late, she has gained some back.   EtOH use: No  Restrictive diet? Not currently Family history of neuropathy/myopathy/NM disease? No  06/15/22: Labs, including inflammatory markers and CK, were within normal limits. EMG on 06/06/22 was normal with no evidence of peripheral nerve disorder or of myopathy.   She is currently on a 1.5 week taper of steroids due to increased pain prescribed by rheumatology. Her symptoms are better with the steroids. Patient saw her eye doctor recently her told her she had cataracts in her left eye. This may explain her blurry vision, but would not explain the improvement with steroids.   She has not had any more symptoms of ptosis, no difficulty with swallowing or chewing, and no problems breathing.   She continues to having burning and aching in her thighs, hamstrings, and arms. It increases if she is using it.   She is not currently taking  gabapentin regularly. She takes it every other week. It makes her tired and maybe works some.  Most recent Assessment and Plan (06/15/22): This is Teresa Kelley, a 62 y.o. female with subjective weakness and diffuse pain in all extremities. She also has intermittent blurry vision/vision loss in left eye improved by steroids. Her work up including labs and  EMG did not show evidence of peripheral nerve or muscle pathology to explain symptoms. Her diffuse pain and pain to palpation of extremities could be 2/2 fibromyalgia. That steroids helps with the pain is curious. I do not have a good explanation of eye symptoms either. She was found to have cataract on that eye, but again, not clear why steroids would help this.   Plan: -Getting cataract surgery on left eye. If not improving vision, will consider MRI (brain and/or orbits) -Start Cymbalta 30 mg qhs -Continue Gabapentin 300 mg qhs (will only take when not working per patient preference)  Since their last visit: ***  MEDICATIONS:  Outpatient Encounter Medications as of 12/02/2022  Medication Sig   abatacept in sodium chloride 0.9 % Inject into the vein every 28 (twenty-eight) days. Infusion once a month   cephALEXin (KEFLEX) 500 MG capsule Take one cap PO Q6hr (Patient not taking: Reported on 06/15/2022)   cholecalciferol (VITAMIN D3) 25 MCG (1000 UNIT) tablet Take 1,000 Units by mouth daily.   diclofenac sodium (VOLTAREN) 1 % GEL Apply 4 g topically 4 (four) times daily. To affected joint. (Patient not taking: Reported on 06/15/2022)   DULoxetine (CYMBALTA) 30 MG capsule TAKE 1 CAPSULE BY MOUTH EVERY DAY   gabapentin (NEURONTIN) 300 MG capsule Take 1 capsule (300 mg total) by mouth at bedtime. Take at your bedtime (morning) (Patient taking differently: Take 300 mg by mouth at bedtime. prn)   hydroxychloroquine (PLAQUENIL) 200 MG tablet Take 200 mg by mouth daily.   ibuprofen (ADVIL) 800 MG tablet Take 800 mg by mouth every 8 (eight) hours.   predniSONE (DELTASONE) 20 MG tablet Take 2 tablets (40 mg total) by mouth daily with breakfast. (Patient not taking: Reported on 04/06/2022)   No facility-administered encounter medications on file as of 12/02/2022.    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  ANA positive.   Vaginal Pap smear, abnormal     PAST SURGICAL HISTORY: Past Surgical History:  Procedure Laterality Date   COLPOSCOPY      ALLERGIES: Allergies  Allergen Reactions   Amoxicillin Nausea And Vomiting    FAMILY HISTORY: Family History  Problem Relation Age of Onset   Cancer Father    Colon cancer Father     SOCIAL HISTORY: Social History   Tobacco Use   Smoking status: Former    Current packs/day: 0.00    Average packs/day: 1 pack/day for 30.0 years (30.0 ttl pk-yrs)    Types: Cigarettes    Start date: 08/18/1987    Quit date: 08/17/2017    Years since quitting: 5.2   Smokeless tobacco: Never  Vaping Use   Vaping status: Never Used  Substance Use Topics   Alcohol use: No   Drug use: No   Social History   Social History Narrative   Are you right handed or left handed? Right handed    Are you currently employed ?  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 yes  Who lives with you? no   What type of home do you live in: 1 story or 2 story? 2nd floor    Caffeine 1 coffee a day      Objective:  Vital Signs:  There were no vitals taken for this visit.  ***  Labs and Imaging review: New results: ***  Previously reviewed results: 04/06/22: CK: 63 Aldolase: 4.8 HbA1c: 5.6 B12: 361 ESR: 30 CRP: < 1.0 IFE: Normal pattern, no M protein  CK (02/02/2017): elevated to 450   External labs: (01/2022) Normal or unremarkable: CMP, CBC, CRP, ESR, aldolase (3.9), CK 108 (12/22/21), CRP, Hep B, Hep C, TB quant gold, TSH (0.90), Vit D, myositis panel   Positive SSA (Ro60), ANA     Lumbar spine xray (12/13/17): FINDINGS: There is gentle curvature convex toward the right centered at the thoracolumbar junction. The lumbar vertebral bodies are preserved in height. The disc space heights are well maintained. There is no spondylolisthesis. There is no significant facet joint  hypertrophy. The pedicles and transverse processes are grossly normal.   IMPRESSION: Gentle dextrocurvature centered at the thoracolumbar junction may reflect muscle spasm. No acute or chronic bony abnormality. No significant disc space height loss.   Thoracic spine xray (12/13/17): IMPRESSION: There is no acute or significant chronic bony abnormality of the thoracic spine. Mild dextrocurvature centered at the thoracolumbar junction may be chronic or could be acute and secondary to muscle spasm.   Thoracic aortic atherosclerosis.   Coarse interstitial lung markings bilaterally may reflect chronic fibrosis or less likely acute interstitial pneumonia.  EMG (06/06/22): NCV & EMG Findings: Extensive electrodiagnostic evaluation of the left lower limb with additional nerve conduction studies of the left upper limb shows: Left sural, superficial peroneal/fibular, median, ulnar, and radial sensory responses are within normal limits. Left peroneal/fibular (EDB), tibial (AH), median (APB), and ulnar (ADM) motor responses are within normal limits. Left H Reflex latency is within normal limits. There is no evidence of active or chronic motor axon loss changes affecting any of the tested muscles on needle examination. Motor unit configuration and recruitment pattern is within normal limits.   Impression: This is a normal study. Patient stopped needle evaluation before testing left upper limb due to discomfort. The findings of this study show: No electrodiagnostic evidence of myopathy. No electrodiagnostic evidence of large fiber sensorimotor polyneuropathy. No electrodiagnostic evidence of a left lumbosacral (L2-S1) motor radiculopathy. No electrodiagnostic evidence of a left median mononeuropathy at or distal to the wrist (ie: carpal tunnel syndrome).  Assessment/Plan:  This is Vedika Alvarenga, a 62 y.o. female with: ***   Plan: ***  Return to clinic in ***  Total time spent  reviewing records, interview, history/exam, documentation, and coordination of care on day of encounter:  *** min  Jacquelyne Balint, MD

## 2022-12-02 ENCOUNTER — Ambulatory Visit: Payer: BC Managed Care – PPO | Admitting: Neurology

## 2022-12-02 ENCOUNTER — Encounter: Payer: Self-pay | Admitting: Neurology

## 2022-12-16 ENCOUNTER — Ambulatory Visit: Payer: BC Managed Care – PPO | Admitting: Neurology
